# Patient Record
Sex: Female | Born: 1970 | Race: White | Hispanic: No | Marital: Married | Smoking: Current every day smoker
Health system: Southern US, Community
[De-identification: ages and names within clinical notes are randomized; demographics above are authoritative.]

## PROBLEM LIST (undated history)

## (undated) DIAGNOSIS — N979 Female infertility, unspecified: Secondary | ICD-10-CM

## (undated) DIAGNOSIS — O1490 Unspecified pre-eclampsia, unspecified trimester: Secondary | ICD-10-CM

## (undated) DIAGNOSIS — E282 Polycystic ovarian syndrome: Secondary | ICD-10-CM

## (undated) DIAGNOSIS — L409 Psoriasis, unspecified: Secondary | ICD-10-CM

## (undated) HISTORY — DX: Polycystic ovarian syndrome: E28.2

## (undated) HISTORY — DX: Female infertility, unspecified: N97.9

## (undated) HISTORY — PX: KNEE SURGERY: SHX244

## (undated) HISTORY — PX: CHOLECYSTECTOMY: SHX55

## (undated) HISTORY — DX: Psoriasis, unspecified: L40.9

## (undated) HISTORY — DX: Unspecified pre-eclampsia, unspecified trimester: O14.90

---

## 2005-12-16 ENCOUNTER — Emergency Department (HOSPITAL_COMMUNITY): Admission: EM | Admit: 2005-12-16 | Discharge: 2005-12-17 | Payer: Self-pay | Admitting: Emergency Medicine

## 2006-02-13 ENCOUNTER — Ambulatory Visit: Payer: Self-pay | Admitting: Oncology

## 2006-03-12 LAB — CBC & DIFF AND RETIC
BASO%: 0.8 % (ref 0.0–2.0)
EOS%: 2.7 % (ref 0.0–7.0)
HCT: 43.1 % (ref 34.8–46.6)
HGB: 14.9 g/dL (ref 11.6–15.9)
LYMPH%: 32.7 % (ref 14.0–48.0)
MCHC: 34.5 g/dL (ref 32.0–36.0)
MCV: 84.3 fL (ref 81.0–101.0)
NEUT%: 54.1 % (ref 39.6–76.8)
RBC: 5.11 10*6/uL (ref 3.70–5.32)
RDW: 13.4 % (ref 11.3–14.5)
RETIC #: 121.6 10*3/uL — ABNORMAL HIGH (ref 19.7–115.1)
WBC: 7.1 10*3/uL (ref 3.9–10.0)

## 2006-03-12 LAB — MORPHOLOGY: PLT EST: ADEQUATE

## 2006-03-15 LAB — LUPUS ANTICOAGULANT PANEL
DRVVT 1:1 Mix: 41.1 secs (ref 26.75–42.95)
DRVVT: 46.4 secs — ABNORMAL HIGH (ref 26.75–42.95)
PTT Lupus Anticoagulant: 43.7 secs — ABNORMAL HIGH (ref 30.5–43.1)
PTTLA 4:1 Mix: 39.6 secs (ref 30.5–43.1)

## 2006-03-15 LAB — CARDIOLIPIN ANTIBODIES, IGG, IGM, IGA
Anticardiolipin IgA: 11 [APL'U] (ref <13)
Anticardiolipin IgG: <7 [GPL'U] (ref <11)
Anticardiolipin IgM: <7 [MPL'U] (ref <10)

## 2006-03-15 LAB — BETA-2 GLYCOPROTEIN ANTIBODIES: Beta-2-Glycoprotein I IgA: <4 U/mL (ref <10)

## 2006-06-29 ENCOUNTER — Emergency Department (HOSPITAL_COMMUNITY): Admission: EM | Admit: 2006-06-29 | Discharge: 2006-06-29 | Payer: Self-pay | Admitting: Family Medicine

## 2007-02-02 ENCOUNTER — Emergency Department (HOSPITAL_COMMUNITY): Admission: EM | Admit: 2007-02-02 | Discharge: 2007-02-02 | Payer: Self-pay | Admitting: Family Medicine

## 2007-03-18 ENCOUNTER — Ambulatory Visit (HOSPITAL_COMMUNITY): Admission: RE | Admit: 2007-03-18 | Discharge: 2007-03-18 | Payer: Self-pay | Admitting: Obstetrics & Gynecology

## 2007-07-29 ENCOUNTER — Emergency Department (HOSPITAL_COMMUNITY): Admission: EM | Admit: 2007-07-29 | Discharge: 2007-07-29 | Payer: Self-pay | Admitting: Emergency Medicine

## 2007-09-26 HISTORY — PX: ELBOW SURGERY: SHX618

## 2008-05-17 ENCOUNTER — Emergency Department (HOSPITAL_COMMUNITY): Admission: EM | Admit: 2008-05-17 | Discharge: 2008-05-17 | Payer: Self-pay | Admitting: Family Medicine

## 2011-04-07 ENCOUNTER — Emergency Department (HOSPITAL_COMMUNITY)
Admission: EM | Admit: 2011-04-07 | Discharge: 2011-04-08 | Disposition: A | Discharge status: Home / Self Care | Payer: Self-pay | Attending: Emergency Medicine | Admitting: Emergency Medicine

## 2011-04-07 DIAGNOSIS — R0789 Other chest pain: Secondary | ICD-10-CM | POA: Insufficient documentation

## 2011-04-07 DIAGNOSIS — T6391XA Toxic effect of contact with unspecified venomous animal, accidental (unintentional), initial encounter: Secondary | ICD-10-CM | POA: Insufficient documentation

## 2011-04-07 DIAGNOSIS — F17200 Nicotine dependence, unspecified, uncomplicated: Secondary | ICD-10-CM | POA: Insufficient documentation

## 2011-04-07 DIAGNOSIS — R0602 Shortness of breath: Secondary | ICD-10-CM | POA: Insufficient documentation

## 2011-04-07 DIAGNOSIS — T63461A Toxic effect of venom of wasps, accidental (unintentional), initial encounter: Secondary | ICD-10-CM | POA: Insufficient documentation

## 2011-04-07 DIAGNOSIS — M7989 Other specified soft tissue disorders: Secondary | ICD-10-CM | POA: Insufficient documentation

## 2012-07-02 ENCOUNTER — Emergency Department (HOSPITAL_COMMUNITY): Payer: Medicaid Other

## 2012-07-02 ENCOUNTER — Emergency Department (HOSPITAL_COMMUNITY)
Admission: EM | Admit: 2012-07-02 | Discharge: 2012-07-02 | Disposition: A | Discharge status: Home / Self Care | Payer: Medicaid Other | Attending: Emergency Medicine | Admitting: Emergency Medicine

## 2012-07-02 ENCOUNTER — Encounter (HOSPITAL_COMMUNITY): Payer: Self-pay | Admitting: Physical Medicine and Rehabilitation

## 2012-07-02 DIAGNOSIS — S40029A Contusion of unspecified upper arm, initial encounter: Secondary | ICD-10-CM

## 2012-07-02 DIAGNOSIS — M25539 Pain in unspecified wrist: Secondary | ICD-10-CM | POA: Insufficient documentation

## 2012-07-02 MED ORDER — TRAMADOL HCL 50 MG PO TABS: 50.0000 mg | ORAL_TABLET | Freq: Four times a day (QID) | ORAL | Status: DC | PRN

## 2012-07-02 NOTE — ED Provider Notes (Cosign Needed)
History  Scribed for [name], MD, the patient was seen in room TR05C/TR05C. This chart was scribed by Candelaria Stagers. The patient's care started at 11:30 AM   CSN: [id number]  Arrival date & time 07/02/12  4696   First MD Initiated Contact with Patient 07/02/12 1129      Chief Complaint  Patient presents with  . Arm Pain    Patient is a 41 y.o. female presenting with arm pain. The history is provided by the patient. No language interpreter was used.  Arm Pain This is a new problem. The current episode started 12 to 24 hours ago. The problem occurs constantly. The problem has not changed since onset.Pertinent negatives include no chest pain, no abdominal pain and no headaches. Nothing aggravates the symptoms. Nothing relieves the symptoms. She has tried acetaminophen for the symptoms. The treatment provided no relief.   Traci Walker is a 41 y.o. female who presents to the Emergency Department complaining of left arm pain after hitting her arm on a door knob last night.  Pt reports that the pain radiates down to her wrist.  The forearm is swollen.  Pt has taken tylenol with no relief.     No past medical history on file.  No past surgical history on file.  No family history on file.  History  Substance Use Topics  . Smoking status: Never Smoker   . Smokeless tobacco: Not on file  . Alcohol Use: No    OB History    Grav Para Term Preterm Abortions TAB SAB Ect Mult Living                  Review of Systems  Constitutional: Negative for fatigue.  HENT: Negative for congestion, sinus pressure and ear discharge.   Eyes: Negative for discharge.  Respiratory: Negative for cough.   Cardiovascular: Negative for chest pain.  Gastrointestinal: Negative for abdominal pain and diarrhea.  Genitourinary: Negative for frequency and hematuria.  Musculoskeletal: Positive for arthralgias (left forearm pain and swelling). Negative for back pain.  Skin: Negative for rash.    Neurological: Negative for seizures and headaches.  Hematological: Negative.   Psychiatric/Behavioral: Negative for hallucinations.    Allergies  Codeine and Vicodin  Home Medications   Current Outpatient Rx  Name Route Sig Dispense Refill  . IBUPROFEN 200 MG PO TABS Oral Take 200 mg by mouth every 6 (six) hours as needed. For pain    . METFORMIN HCL 500 MG PO TABS Oral Take 500 mg by mouth 2 (two) times daily with a meal.    . THERA M PLUS PO TABS Oral Take 1 tablet by mouth daily.      BP 120/85  Pulse 86  Temp 97.8 F (36.6 C) (Oral)  Resp 20  SpO2 97%  Physical Exam  Constitutional: She is oriented to person, place, and time. She appears well-developed.  HENT:  Head: Normocephalic.  Eyes: Conjunctivae normal are normal.  Neck: No tracheal deviation present.  Cardiovascular:  No murmur heard. Musculoskeletal: Normal range of motion.       Tenderness to proximal left forearm with swelling.  Neurovascularly intact.      Neurological: She is oriented to person, place, and time.  Skin: Skin is warm.  Psychiatric: She has a normal mood and affect.    ED Course  Procedures   DIAGNOSTIC STUDIES: Oxygen Saturation is 97% on room air, normal by my interpretation.    COORDINATION OF CARE:  11:34 Ordered: DG  Forearm Left 12:44 PM Recheck: Discussed imaging results with pt and care for arm injury.   Labs Reviewed - No data to display Dg Forearm Left  07/02/2012  *RADIOLOGY REPORT*  Clinical Data: Trauma.  LEFT FOREARM - 2 VIEW  Comparison: None.  Findings: No fracture or dislocation  IMPRESSION: .  No fracture.   Original Report Authenticated By: Fuller Canada, M.D.      No diagnosis found.    MDM      The chart was scribed for me under my direct supervision.  I personally performed the history, physical, and medical decision making and all procedures in the evaluation of this patient.[name], MD 07/02/12 1246

## 2012-07-02 NOTE — ED Notes (Signed)
Pt presents to department for evaluation of L forearm pain. States she struck arm on doorknob last night. Now states pain and discomfort with movement. 8/10 at the time. Able to wiggle digits and move arm without difficulty. She is alert and oriented x4. No signs of distress noted.

## 2013-04-09 ENCOUNTER — Encounter: Payer: Self-pay | Admitting: Certified Nurse Midwife

## 2013-04-15 ENCOUNTER — Ambulatory Visit: Payer: Self-pay | Admitting: Certified Nurse Midwife

## 2013-05-13 ENCOUNTER — Ambulatory Visit (INDEPENDENT_AMBULATORY_CARE_PROVIDER_SITE_OTHER): Payer: Medicaid Other | Admitting: Certified Nurse Midwife

## 2013-05-13 ENCOUNTER — Encounter: Payer: Self-pay | Admitting: Certified Nurse Midwife

## 2013-05-13 VITALS — BP 102/68 | HR 60 | Resp 16 | Ht 63.75 in | Wt 225.0 lb

## 2013-05-13 DIAGNOSIS — Z0000 Encounter for general adult medical examination without abnormal findings: Secondary | ICD-10-CM

## 2013-05-13 DIAGNOSIS — Z01419 Encounter for gynecological examination (general) (routine) without abnormal findings: Secondary | ICD-10-CM

## 2013-05-13 LAB — POCT URINALYSIS DIPSTICK
Bilirubin, UA: NEGATIVE
Ketones, UA: NEGATIVE
Leukocytes, UA: NEGATIVE
pH, UA: 5

## 2013-05-13 LAB — HEMOGLOBIN, FINGERSTICK: Hemoglobin, fingerstick: 15.1 g/dL (ref 12.0–16.0)

## 2013-05-13 NOTE — Patient Instructions (Addendum)

## 2013-05-13 NOTE — Progress Notes (Signed)
42 y.o. G73P1001 Married Caucasian Fe here for annual exam. Periods normal except one light period. Contraception none desired, due to history of infertility. Has started new exercise program. Busy with caring spouse with work related back injured. Currently not working. No health issues, today.  Sees PCP prn and had labs done all normal. Has been treating ingrown hair  With epsom salt soaks, opened and drained now healing. Denies fever or chills.  Has been "sweating a lot with exercise.  No other health issues today.  Patient's last menstrual period was 04/27/2013.          Sexually active: yes  The current method of family planning is none.    Exercising: yes  karate, kickboxing & krav maga Smoker:  yes  Health Maintenance: Pap: 04-10-12 neg HPV HR neg MMG:  none Colonoscopy:  none BMD:   none TDaP:  2006 Labs: Poct urine-neg, Hgb-15.1 Self breast exam: done occ   reports that she has been smoking.  She does not have any smokeless tobacco history on file. She reports that she does not drink alcohol or use illicit drugs.  Past Medical History  Diagnosis Date  . Toxemia in pregnancy   . PCOS (polycystic ovarian syndrome)   . Infertility, female     clomid use  . Psoriasis     Past Surgical History  Procedure Laterality Date  . Cholecystectomy    . Cesarean section    . Elbow surgery  2009    right elbow, release of nerve    Current Outpatient Prescriptions  Medication Sig Dispense Refill  . ibuprofen (ADVIL,MOTRIN) 200 MG tablet Take 200 mg by mouth every 6 (six) hours as needed. For pain       No current facility-administered medications for this visit.    Family History  Problem Relation Age of Onset  . Factor VIII deficiency Father   . Factor VIII deficiency Son     ROS:  Pertinent items are noted in HPI.  Otherwise, a comprehensive ROS was negative.  Exam:   BP 102/68  Pulse 60  Resp 16  Ht 5' 3.75" (1.619 m)  Wt 225 lb (102.059 kg)  BMI 38.94 kg/m2  LMP  04/27/2013 Height: 5' 3.75" (161.9 cm)  Ht Readings from Last 3 Encounters:  05/13/13 5' 3.75" (1.619 m)    General appearance: alert, cooperative and appears stated age Head: Normocephalic, without obvious abnormality, atraumatic Neck: no adenopathy, supple, symmetrical, trachea midline and thyroid normal to inspection and palpation Lungs: clear to auscultation bilaterally Breasts: normal appearance, no masses or tenderness, No nipple retraction or dimpling, No nipple discharge or bleeding, No axillary or supraclavicular adenopathy Heart: regular rate and rhythm Abdomen: soft, non-tender; no masses,  no organomegaly Extremities: extremities normal, atraumatic, no cyanosis or edema Skin: Skin color, texture, turgor normal. No rashes or lesions Lymph nodes: Cervical, supraclavicular, and axillary nodes normal. No abnormal inguinal nodes palpated Neurologic: Grossly normal   Pelvic: External genitalia:  no lesions              Urethra:  normal appearing urethra with no masses, tenderness or lesions              Bartholin's and Skene's: normal                 Vagina: normal appearing vagina with normal color and discharge, no lesions              Cervix: normal, non tender  Pap taken: no Bimanual Exam:  Uterus:  normal size, contour, position, consistency, mobility, non-tender and mid position              Adnexa: normal adnexa and no mass, fullness, tenderness               Rectovaginal: Confirms               Anus:  normal sphincter tone, no lesions  A:  Well Woman with normal exam  Contraception none desired infertility history  Healing in grown hair cyst  Social stress with spouse  P:   Reviewed health and wellness pertinent to exam  Continue epsom salt soaks until resolves, come in if not resolving or other areas occur  Seek family support and friends support and time for self  Pap smear as per guidelines   Mammogram yearly, given information to schedule pap  smear not taken today  counseled on breast self exam, mammography screening, adequate intake of calcium and vitamin D, diet and exercise, Kegel's exercises  return annually or prn  An After Visit Summary was printed and given to the patient.

## 2013-05-15 ENCOUNTER — Other Ambulatory Visit: Payer: Self-pay

## 2013-05-15 DIAGNOSIS — Z1231 Encounter for screening mammogram for malignant neoplasm of breast: Secondary | ICD-10-CM

## 2013-05-16 NOTE — Progress Notes (Signed)
Note reviewed, agree with plan.  Aila Terra, MD  

## 2013-06-04 ENCOUNTER — Ambulatory Visit
Admission: RE | Admit: 2013-06-04 | Discharge: 2013-06-04 | Disposition: A | Discharge status: Home / Self Care | Payer: Medicaid Other | Source: Ambulatory Visit

## 2013-06-04 DIAGNOSIS — Z1231 Encounter for screening mammogram for malignant neoplasm of breast: Secondary | ICD-10-CM

## 2013-06-06 ENCOUNTER — Other Ambulatory Visit: Payer: Self-pay | Admitting: Certified Nurse Midwife

## 2013-06-06 DIAGNOSIS — R928 Other abnormal and inconclusive findings on diagnostic imaging of breast: Secondary | ICD-10-CM

## 2013-06-13 ENCOUNTER — Ambulatory Visit
Admission: RE | Admit: 2013-06-13 | Discharge: 2013-06-13 | Disposition: A | Discharge status: Home / Self Care | Payer: Medicaid Other | Source: Ambulatory Visit | Attending: Certified Nurse Midwife | Admitting: Certified Nurse Midwife

## 2013-06-13 DIAGNOSIS — R928 Other abnormal and inconclusive findings on diagnostic imaging of breast: Secondary | ICD-10-CM

## 2013-06-16 NOTE — Progress Notes (Signed)
Placed on Mammo Hold 06/06/13 cm

## 2013-06-19 ENCOUNTER — Telehealth: Payer: Self-pay | Admitting: Emergency Medicine

## 2013-06-19 NOTE — Telephone Encounter (Signed)
Message copied by Joeseph Amor on Thu Jun 19, 2013  2:16 PM ------      Message from: Verner Chol      Created: Wed Jun 18, 2013  8:13 AM       Notify patient reviewed mammogram and [place] agree with findings. Keep aex for breast exam, mammogram as designated and do SBE, if change needs OV      Can take out MM hold ------

## 2013-06-19 NOTE — Progress Notes (Signed)
Spoke with patient, result given. Will follow up prn.

## 2013-06-19 NOTE — Telephone Encounter (Signed)
Fowarding to Dr. Hyacinth Meeker for MD check, to review to remove from [facility] hold.

## 2013-06-19 NOTE — Telephone Encounter (Signed)
Okay per Dr. Hyacinth Meeker to remove.

## 2013-07-31 ENCOUNTER — Other Ambulatory Visit: Payer: Self-pay

## 2013-09-04 ENCOUNTER — Telehealth: Payer: Self-pay | Admitting: Certified Nurse Midwife

## 2013-09-04 NOTE — Telephone Encounter (Signed)
Pt thinks she may be pregnant she would like to talk with the nurse.

## 2013-09-04 NOTE — Telephone Encounter (Signed)
Spoke with pt who desires pregnancy. LMP 08-02-13. Pt recently lost 25 lbs and typically has menses every 32 days. Pt has talked to DL about progesterone shots at earlier visit. Pt reports she is about 2 days late at this point and has had a little nausea with some cramping and spotting last week. Has not taken UPT. How would you like pt to proceed? OV?

## 2013-09-04 NOTE — Telephone Encounter (Signed)
LMTCB  aa 

## 2013-09-04 NOTE — Telephone Encounter (Signed)
She will need OV

## 2013-09-05 NOTE — Telephone Encounter (Signed)
Patient states she has started spotting last night and has increased bleeding. Feels she has started menses. Advised of message from Verner Chol CNM that needs office visit to discuss bleeding. Patient is agreeable. Discussed OV for 12/17 at 10:00. Patient is medicaid, needs referral for OV.

## 2013-09-05 NOTE — Telephone Encounter (Signed)
Patient was returning call to amy.

## 2013-09-08 NOTE — Telephone Encounter (Signed)
Appointment scheduled. Patient aware.   Routing to provider for final review. Patient agreeable to disposition. Will close encounter

## 2013-09-10 ENCOUNTER — Ambulatory Visit: Payer: Medicaid Other | Admitting: Certified Nurse Midwife

## 2013-10-02 ENCOUNTER — Ambulatory Visit (INDEPENDENT_AMBULATORY_CARE_PROVIDER_SITE_OTHER): Payer: Medicaid Other | Admitting: Certified Nurse Midwife

## 2013-10-02 ENCOUNTER — Encounter: Payer: Self-pay | Admitting: Certified Nurse Midwife

## 2013-10-02 VITALS — BP 104/64 | HR 68 | Resp 16 | Ht 63.75 in | Wt 218.0 lb

## 2013-10-02 DIAGNOSIS — Z3009 Encounter for other general counseling and advice on contraception: Secondary | ICD-10-CM

## 2013-10-02 NOTE — Progress Notes (Signed)
43 y.o. Married Caucasian female G1P1001 here for pre conceptual consult.Patient has been having regular periods for the past 6 months now. She has been working on weight loss,(down 32 pounds) exercise and decrease smoking. Smoking now less than half a pack now. Periods are exactly 34 days and LMP 09/01/13,  was again 34 days Patient has noted ovulatory mucous each time around 17-18 day of cycle. She and spouse have been trying for pregnancy the past 2 months. She would really like another baby!. Spouse supportive. Here today to validate her cycles and discuss concerns.  Not currently on prenatal vitamins, but plans to start today. Patient had fertility assistance years ago (IUI) x1 and it did not work. Spouse has reservations about seeing infertility again, but will if patient desires.  O: Healthy WD,WN female Affect: Normal, orientation x 3 Aex 05/13/13 normal, pap smear normal with negative HPVHR in 2013   A:History of PCOS with irregular cycles and infertility,cycles now very regular, with health and weight change Clomid use with first pregnancy 2004 Trial of Clomid here in 2013.   P: Discussed history of fertility/irregular cycles and clomid use. Discussed current age with another pregnancy. Patient feels she is in the best health ever. Discussed with this being the case,utilizing the best option for the best chance of pregnancy would be encouraged. Recommended consult visit with [facility]. Patient would prefer this group. Patient questions addressed and agrees with this and feels spouse will too. Patient to await to see if she has next period and then will advise regarding referral.. Patient plans ovulation kit and temperature checks   RV prn   35 minutes spent with patient with >50% of time spent in face to face counseling.

## 2013-10-03 NOTE — Progress Notes (Signed)
Reviewed personally.  M. Suzanne Breeonna Mone, MD.  

## 2014-01-08 ENCOUNTER — Telehealth: Payer: Self-pay | Admitting: Certified Nurse Midwife

## 2014-01-08 NOTE — Telephone Encounter (Signed)
Spoke with pt who has been trying to conceive. Pt was about 2-3 days late with her cycle on Monday when she noticed bleeding and cramps in her lower back. Today she passed a big clot about an inch and a half long with what looked like a grain of rice in it or "something the size of a pomegranate seed." Today pt went through a super tampon in an hour while she was out shopping. Pt is having cramps as well. Pt has not taken a UPT and did not know for sure if she was pregnant. Please advise. OV today or tomorrow?

## 2014-01-08 NOTE — Telephone Encounter (Signed)
Spoke with pt to advise per DL that she could take a pregnancy test and monitor bleeding over the next hour or 2. Appt scheduled for tomorrow am at 10 with DL. Pt reports this is Day 4 of her bleeding and it is a little heavier than usual cycle. Pt will call back as needed for heavier bleeding or further clots. Pt will take UPT as well.

## 2014-01-08 NOTE — Telephone Encounter (Signed)
Patient calling to report, " I have been bleeding heavily for the past three days and passed a big clot with a white, grain of rice-like substance in it." Patient reports she has been trying to get pregnant and want to speak with nurse.

## 2014-01-09 ENCOUNTER — Ambulatory Visit (INDEPENDENT_AMBULATORY_CARE_PROVIDER_SITE_OTHER): Payer: Medicaid Other | Admitting: Certified Nurse Midwife

## 2014-01-09 ENCOUNTER — Encounter: Payer: Self-pay | Admitting: Certified Nurse Midwife

## 2014-01-09 VITALS — BP 96/60 | HR 68 | Temp 98.0°F | Resp 16 | Ht 63.75 in | Wt 221.0 lb

## 2014-01-09 DIAGNOSIS — Z0189 Encounter for other specified special examinations: Secondary | ICD-10-CM

## 2014-01-09 DIAGNOSIS — N912 Amenorrhea, unspecified: Secondary | ICD-10-CM

## 2014-01-09 LAB — POCT URINE PREGNANCY: Preg Test, Ur: NEGATIVE

## 2014-01-09 LAB — HCG, QUANTITATIVE, PREGNANCY: hCG, Beta Chain, Quant, S: <2 m[IU]/mL

## 2014-01-09 LAB — HEMOGLOBIN, FINGERSTICK: Hemoglobin, fingerstick: 15.2 g/dL (ref 12.0–16.0)

## 2014-01-09 NOTE — Telephone Encounter (Signed)
Agree Closed encounter

## 2014-01-09 NOTE — Progress Notes (Signed)
  43 y.o.Married Caucasian female presents with this period being 3 days late with onset of heavy bleeding with passage of small clot with ? White grain like substance with cramping.( patient brought with her). Patient has been not using contraception, hoping for pregnancy. Patient reports bleeding onset 01/05/14 and is now moderate to small. Previous period was 12/01/12 with 5 day duration. Denies any pregnancy symptoms or home UPT use. Patient felt like the onset of this period was different. UPT here negative. Patient denies any other symptoms. Has used only 2 pads this am.   General appearance: alert, cooperative, no distress and mildly obese Abdomen: non tender, soft Pelvic: cervix normal in appearance, external genitalia normal, no adnexal masses or tenderness, no bladder tenderness, no cervical motion tenderness, uterus normal size, shape, and consistency and small amount of blood in vagina and from cervix noted Skin: warm and dry  Assessment:  Normal pelvic exam ?SAB Period late by 3 days with change in onset,duration same, bleeding small on exam Negative UPT  Plan: Discussed findings of normal exam and normal bleeding profile by description. Discussed cycle changes with age and weight changes also.  Discussed negative UPT, but will draw HCG quantative and advise patient of results. Will manage per results. Lab HCG Quant., POCT Hgb. Specimen to pathology patient brought in. Warning signs of heavy bleeding given. Questions addressed  Rv prn.

## 2014-01-13 LAB — IPS OTHER TISSUE BIOPSY

## 2014-01-16 NOTE — Progress Notes (Signed)
Reviewed personally.  M. Suzanne Kieren Adkison, MD.  

## 2014-01-23 ENCOUNTER — Telehealth: Payer: Self-pay | Admitting: *Deleted

## 2014-01-23 NOTE — Telephone Encounter (Signed)
Follow up call to patient. Notified of path result as directed by Debbi. She states bleeding resolved about 4 days after OV.  Denies problems with bleeding now.  Instructed to call back if reoccurs.  Routing to provider for final review. Patient agreeable to disposition. Will close encounter

## 2014-01-23 NOTE — Telephone Encounter (Signed)
Message copied by Alisa GraffYEAKLEY, Fadil Macmaster on Fri Jan 23, 2014  1:53 PM ------      Message from: Verner CholLEONARD, DEBORAH S      Created: Tue Jan 13, 2014 10:58 AM       Notify patient that tissue sent to pathology did not contain any products of conception      It contained just benign tissue from endometrium(lining of uterus)      Bleeding status now? ------

## 2014-07-10 ENCOUNTER — Other Ambulatory Visit: Payer: Self-pay

## 2014-07-27 ENCOUNTER — Encounter: Payer: Self-pay | Admitting: Certified Nurse Midwife

## 2014-09-16 ENCOUNTER — Encounter: Payer: Self-pay | Admitting: Physical Therapy

## 2014-09-16 ENCOUNTER — Ambulatory Visit: Payer: Medicaid Other | Attending: Orthopedic Surgery | Admitting: Physical Therapy

## 2014-09-16 DIAGNOSIS — M25562 Pain in left knee: Secondary | ICD-10-CM | POA: Diagnosis present

## 2014-09-16 DIAGNOSIS — G8929 Other chronic pain: Secondary | ICD-10-CM

## 2014-09-16 NOTE — Therapy (Signed)
[facility] [address] [place], [place], [address] Phone: [phone]   Fax:  [phone]  Physical Therapy Evaluation and DISCHARGE Patient Details  Name: Traci Walker MRN: [id number] Date of Birth: 11/19/1970  Encounter Date: 09/16/2014      PT End of Session - 09/16/14 1225    Visit Number 1   Number of Visits 1   PT Start Time 1105   PT Stop Time 1145   PT Time Calculation (min) 40 min   Activity Tolerance Patient tolerated treatment well      Past Medical History  Diagnosis Date  . Toxemia in pregnancy   . PCOS (polycystic ovarian syndrome)   . Infertility, female     clomid use  . Psoriasis     Past Surgical History  Procedure Laterality Date  . Cholecystectomy    . Cesarean section    . Elbow surgery  2009    right elbow, release of nerve    There were no vitals taken for this visit.  Visit Diagnosis:  Knee pain, chronic, left - Plan: PT plan of care cert/re-cert      Subjective Assessment - 09/16/14 1115    Symptoms Pain, popping and stiffness in Lt. knee.     Limitations Standing;Walking;House hold activities;Other (comment)  prolonged bending, stairs, running   How long can you sit comfortably? 15-20 min   How long can you stand comfortably? 15-20 min   How long can you walk comfortably? 15-20 min    Diagnostic tests Neg MRI, pos chondromalacia patella   Patient Stated Goals return to activity [employer])   Currently in Pain? Yes   Pain Score 1   4/10 with actvitiy   Pain Location Knee   Pain Orientation Left;Medial;Anterior;Lateral   Pain Descriptors / Indicators Nagging;Shooting  annoying   Pain Type Chronic pain   Pain Onset More than a month ago   Pain Frequency Intermittent   Aggravating Factors  walking, standing    Pain Relieving Factors Aleve, prop   Effect of Pain on Daily Activities can't be as active as i want to be   Multiple Pain Sites No          [facility] -  09/16/14 1125    Assessment   Medical Diagnosis --  L. knee pain   Onset Date 03/17/14   Next MD Visit Oct 20, 2014   Prior Therapy no   Precautions   Required Braces or Orthoses --  not required but wears L. knee brace when out   Balance Screen   Has the patient fallen in the past 6 months No   Has the patient had a decrease in activity level because of a fear of falling?  No   Is the patient reluctant to leave their home because of a fear of falling?  No   Sensation   Light Touch Appears Intact   Coordination   Gross Motor Movements are Fluid and Coordinated Yes   Fine Motor Movements are Fluid and Coordinated Not tested   Posture/Postural Control   Posture Comments WNL   AROM   Right Knee Extension --  0   Right Knee Flexion 138   Left Knee Extension 0   Left Knee Flexion 132   Strength   Right Hip Flexion 5/5   Right Hip ABduction 5/5   Left Hip Flexion 5/5   Left Hip ABduction 5/5   Right Knee Flexion 5/5   Right Knee Extension  5/5   Left Knee Flexion --  4+/5   Left Knee Extension --  4+/5                  [facility] Adult PT Treatment/Exercise - 09/16/14 1125    Knee/Hip Exercises: Stretches   Active Hamstring Stretch 2 reps;30 seconds   ITB Stretch 2 reps;30 seconds   Knee/Hip Exercises: Standing   Wall Squat 1 set;5 reps   Wall Squat Limitations --  modify for pain   Knee/Hip Exercises: Supine   Straight Leg Raise with External Rotation Left;1 set;10 reps                PT Education - 09/16/14 1140    Education provided Yes   Education Details PT/HEP/Group exercise/[facility]   Person(s) Educated Patient   Methods Explanation;Demonstration;Handout   Comprehension Verbalized understanding;Returned demonstration                    Plan - 09/16/14 1230    Clinical Impression Statement Patient does not have coverage for PT with [facility].  She was given multiple options today for guided exercise, including an upgraded HEP for VMO  and stretching. She may choose to attend our group exercise group for continued care.    Pt will benefit from skilled therapeutic intervention in order to improve on the following deficits Decreased range of motion;Impaired flexibility;Pain;Decreased strength;Decreased mobility   Rehab Potential Excellent   PT Frequency --  eval only    PT Next Visit Plan NA   PT Home Exercise Plan VMO and ITB stretch, hamstrings   Consulted and Agree with Plan of Care Patient         Problem List There are no active problems to display for this patient.   Zlaty Alexa 09/16/2014, 12:38 PM  [facility] [address] [place], [place], [address] Phone: [phone]   Fax:  [phone]  Karie MainlandJennifer Aniel Hubble, PT 09/16/2014 12:39 PM Phone: [phone] Fax: [phone]

## 2014-09-16 NOTE — Patient Instructions (Addendum)
Abduction: Clam (Eccentric) - Side-Lying   Lie on side with knees bent. Lift top knee, keeping feet together. Keep trunk steady. Slowly lower for 3-5 seconds. __20_ reps per set, __2_ sets per day, ___5-7 days per week.   Copyright  VHI. All rights reserved.  Hip Flexion / Knee Extension: Straight-Leg Raise (Eccentric)   Lie on back. Lift leg with knee straight. Slowly lower leg for 3-5 seconds. __10-20_ reps per set, _1-2__ sets per day, __5-7_ days per week. Lower like elevator, stopping at each floor.  Rest on elbows. Rest on straight arms.  Copyright  VHI. All rights reserved.  Leg Extension (Hamstring)   Sit toward front edge of chair, with leg out straight, heel on floor, toes pointing toward body. Keeping back straight, bend forward at hip, breathing out through pursed lips. Return, breathing in. Repeat ___ times. Repeat with other leg. Do ___ sessions per day. Variation: Perform from standing position, with support.  Hamstring Step 1   Straighten left knee. Keep knee level with other knee or on bolster. Hold ___ seconds. Relax knee by returning foot to start. Repeat ___ times.      Hamstring Step 2   Left foot relaxed, knee straight, other leg bent, foot flat. Raise straight leg further upward to maximal range. Hold _30__ seconds. Relax leg completely down. Repeat _3__ times. Lower leg slightly  THEN Cross LT. Leg over midline to stretch ITB (Iliotibial band)

## 2014-09-22 ENCOUNTER — Ambulatory Visit: Payer: Medicaid Other | Admitting: Physical Therapy

## 2015-02-23 ENCOUNTER — Emergency Department (HOSPITAL_COMMUNITY): Payer: Medicaid Other

## 2015-02-23 ENCOUNTER — Encounter (HOSPITAL_COMMUNITY): Payer: Self-pay | Admitting: *Deleted

## 2015-02-23 ENCOUNTER — Emergency Department (HOSPITAL_COMMUNITY)
Admission: EM | Admit: 2015-02-23 | Discharge: 2015-02-23 | Disposition: A | Discharge status: Home / Self Care | Payer: Medicaid Other | Attending: Emergency Medicine | Admitting: Emergency Medicine

## 2015-02-23 DIAGNOSIS — Z79899 Other long term (current) drug therapy: Secondary | ICD-10-CM | POA: Diagnosis not present

## 2015-02-23 DIAGNOSIS — Z872 Personal history of diseases of the skin and subcutaneous tissue: Secondary | ICD-10-CM | POA: Insufficient documentation

## 2015-02-23 DIAGNOSIS — J189 Pneumonia, unspecified organism: Secondary | ICD-10-CM

## 2015-02-23 DIAGNOSIS — Z8639 Personal history of other endocrine, nutritional and metabolic disease: Secondary | ICD-10-CM | POA: Insufficient documentation

## 2015-02-23 DIAGNOSIS — Z720 Tobacco use: Secondary | ICD-10-CM | POA: Insufficient documentation

## 2015-02-23 DIAGNOSIS — J159 Unspecified bacterial pneumonia: Secondary | ICD-10-CM | POA: Diagnosis not present

## 2015-02-23 DIAGNOSIS — Z8742 Personal history of other diseases of the female genital tract: Secondary | ICD-10-CM | POA: Insufficient documentation

## 2015-02-23 DIAGNOSIS — Z792 Long term (current) use of antibiotics: Secondary | ICD-10-CM | POA: Insufficient documentation

## 2015-02-23 DIAGNOSIS — R091 Pleurisy: Secondary | ICD-10-CM | POA: Diagnosis present

## 2015-02-23 LAB — BASIC METABOLIC PANEL
ANION GAP: 9 (ref 5–15)
BUN: 16 mg/dL (ref 6–20)
CALCIUM: 8.8 mg/dL — AB (ref 8.9–10.3)
CO2: 22 mmol/L (ref 22–32)
Chloride: 108 mmol/L (ref 101–111)
Creatinine, Ser: 0.96 mg/dL (ref 0.44–1.00)
GFR calc Af Amer: >60 mL/min (ref >60)
GFR calc non Af Amer: >60 mL/min (ref >60)
Glucose, Bld: 125 mg/dL — ABNORMAL HIGH (ref 65–99)
POTASSIUM: 3.8 mmol/L (ref 3.5–5.1)
SODIUM: 139 mmol/L (ref 135–145)

## 2015-02-23 LAB — CBC
HCT: 41.1 % (ref 36.0–46.0)
Hemoglobin: 13.6 g/dL (ref 12.0–15.0)
MCH: 28.8 pg (ref 26.0–34.0)
MCHC: 33.1 g/dL (ref 30.0–36.0)
MCV: 87.1 fL (ref 78.0–100.0)
Platelets: 258 10*3/uL (ref 150–400)
RBC: 4.72 MIL/uL (ref 3.87–5.11)
RDW: 13.3 % (ref 11.5–15.5)
WBC: 14.2 10*3/uL — ABNORMAL HIGH (ref 4.0–10.5)

## 2015-02-23 LAB — D-DIMER, QUANTITATIVE: D-Dimer, Quant: <0.27 ug/mL-FEU (ref 0.00–0.48)

## 2015-02-23 LAB — I-STAT TROPONIN, ED: TROPONIN I, POC: 0.01 ng/mL (ref 0.00–0.08)

## 2015-02-23 MED ORDER — GI COCKTAIL ~~LOC~~
30.0000 mL | Freq: Once | ORAL | Status: AC
  Administered 2015-02-23: 30 mL via ORAL
  Filled 2015-02-23: qty 30

## 2015-02-23 MED ORDER — AEROCHAMBER PLUS FLO-VU LARGE MISC
1.0000 | Freq: Once | Status: AC
  Administered 2015-02-23: 1
  Filled 2015-02-23: qty 1

## 2015-02-23 MED ORDER — LEVOFLOXACIN 750 MG PO TABS
750.0000 mg | ORAL_TABLET | Freq: Once | ORAL | Status: AC
  Administered 2015-02-23: 750 mg via ORAL
  Filled 2015-02-23: qty 1

## 2015-02-23 MED ORDER — ALBUTEROL SULFATE HFA 108 (90 BASE) MCG/ACT IN AERS
2.0000 | INHALATION_SPRAY | Freq: Once | RESPIRATORY_TRACT | Status: AC
  Administered 2015-02-23: 2 via RESPIRATORY_TRACT
  Filled 2015-02-23: qty 6.7

## 2015-02-23 MED ORDER — PANTOPRAZOLE SODIUM 40 MG IV SOLR
40.0000 mg | INTRAVENOUS | Status: AC
  Administered 2015-02-23: 40 mg via INTRAVENOUS
  Filled 2015-02-23: qty 40

## 2015-02-23 MED ORDER — PANTOPRAZOLE SODIUM 20 MG PO TBEC: 20.0000 mg | DELAYED_RELEASE_TABLET | Freq: Every day | ORAL | Status: DC

## 2015-02-23 MED ORDER — BENZONATATE 100 MG PO CAPS: 100.0000 mg | ORAL_CAPSULE | Freq: Three times a day (TID) | ORAL | Status: DC

## 2015-02-23 MED ORDER — LEVOFLOXACIN 250 MG PO TABS: 750.0000 mg | ORAL_TABLET | Freq: Every day | ORAL | Status: DC

## 2015-02-23 NOTE — ED Notes (Signed)
Pt ambulating independently w/ steady gait on d/c in no acute distress, A&Ox4. D/c instructions reviewed w/ pt and family - pt and family deny any further questions or concerns at present. Rx given x3  

## 2015-02-23 NOTE — ED Notes (Signed)
Bed: WA09 Expected date:  Expected time:  Means of arrival:  Comments: Chest wall pain

## 2015-02-23 NOTE — Discharge Instructions (Signed)

## 2015-02-23 NOTE — ED Provider Notes (Signed)
CSN: [id number]     Arrival date & time 02/23/15  0002 History   First MD Initiated Contact with Patient 02/23/15 0109     Chief Complaint  Patient presents with  . Pleurisy    (Consider location/radiation/quality/duration/timing/severity/associated sxs/prior Treatment) HPI Comments: Patient is a 44 year old female with a history of PCOS and psoriasis who presents to the emergency department for further evaluation of chest pain. Patient states that chest pain has been intermittent and began yesterday. Symptoms worsened this evening. She describes the pain as a sharp and burning pain. Pain initially began yesterday after eating. She felt the discomfort travel to her throat which prompted onset of coughing. Patient has had a dry cough intermittently since this time. Coughing persisted throughout the night; little relief from Mucinex. She awoke today with nasal congestion. Patient reports pain radiating from her epigastric region around her R upper abdomen to her back. Pain is worse with deep breathing. Son sick with strep 2 weeks ago; no other sick contacts. No fever, abdominal pain, N/V/D, or syncope. No leg swelling, hormone replacement use, or recent travel. Patient did have arthroscopic knee surgery within the last month. She did not stay in the hospital overnight following this procedure. No history of DVT/PE.  The history is provided by the patient. No language interpreter was used.    Past Medical History  Diagnosis Date  . Toxemia in pregnancy   . PCOS (polycystic ovarian syndrome)   . Infertility, female     clomid use  . Psoriasis    Past Surgical History  Procedure Laterality Date  . Cholecystectomy    . Cesarean section    . Elbow surgery  2009    right elbow, release of nerve   Family History  Problem Relation Age of Onset  . Factor VIII deficiency Father   . Factor VIII deficiency Son    History  Substance Use Topics  . Smoking status: Current Every Day Smoker -- 0.50  packs/day    Types: Cigarettes  . Smokeless tobacco: Not on file  . Alcohol Use: Yes     Comment: occasionally   OB History    Gravida Para Term Preterm AB TAB SAB Ectopic Multiple Living   1 1 1       1       Review of Systems  Constitutional: Negative for fever.  HENT: Positive for congestion. Negative for trouble swallowing.   Respiratory: Positive for cough. Negative for shortness of breath.   Cardiovascular: Positive for chest pain.  Gastrointestinal: Negative for vomiting and diarrhea.  Neurological: Negative for syncope.  All other systems reviewed and are negative.   Allergies  Codeine and Vicodin  Home Medications   Prior to Admission medications   Medication Sig Start Date End Date Taking? Authorizing Provider  doxycycline (VIBRA-TABS) 100 MG tablet Take 100 mg by mouth 2 (two) times daily. 01/21/15  Yes Historical Provider, MD  ibuprofen (ADVIL,MOTRIN) 200 MG tablet Take 200 mg by mouth every 6 (six) hours as needed (for menstrual pain.). For pain   Yes Historical Provider, MD  ketorolac (TORADOL) 10 MG tablet Take 10 mg by mouth 3 (three) times daily. 02/11/15  Yes Historical Provider, MD  ondansetron (ZOFRAN) 4 MG tablet Take 4 mg by mouth every 6 (six) hours as needed. 02/11/15  Yes Historical Provider, MD  oxyCODONE-acetaminophen (PERCOCET/ROXICET) 5-325 MG per tablet Take 1-2 tablets by mouth every 6 (six) hours as needed. 02/11/15  Yes Historical Provider, MD  Phenyleph-Doxylamine-DM-APAP 5-6.25-10-325 MG/15ML LIQD  Take 30 mLs by mouth once.   Yes Historical Provider, MD  Phenylephrine-APAP-Guaifenesin 10-650-400 MG/20ML LIQD Take 20 mLs by mouth every 6 (six) hours as needed (for cold symptoms).   Yes Historical Provider, MD  benzonatate (TESSALON) 100 MG capsule Take 1 capsule (100 mg total) by mouth every 8 (eight) hours. 02/23/15   Antony Madura, PA-C  levofloxacin (LEVAQUIN) 250 MG tablet Take 3 tablets (750 mg total) by mouth daily. Your next dose is due on the  morning of 02/24/15 02/23/15   Antony Madura, PA-C  pantoprazole (PROTONIX) 20 MG tablet Take 1 tablet (20 mg total) by mouth daily. 02/23/15   Antony Madura, PA-C   BP 120/74 mmHg  Pulse 87  Temp(Src) 98.3 F (36.8 C) (Oral)  Resp 21  SpO2 97%  LMP 02/07/2015 (Exact Date)   Physical Exam  Constitutional: She is oriented to person, place, and time. She appears well-developed and well-nourished. No distress.  Nontoxic/nonseptic appearing  HENT:  Head: Normocephalic and atraumatic.  Mucosal edema in bilateral nares appreciated. Patient tolerating secretions without difficulty.  Eyes: Conjunctivae and EOM are normal. No scleral icterus.  Neck: Normal range of motion.  No nuchal rigidity or meningismus  Cardiovascular: Normal rate, regular rhythm and intact distal pulses.   Pulmonary/Chest: Effort normal and breath sounds normal. No respiratory distress. She has no wheezes. She has no rales.  Respirations even and unlabored. No wheezes or rales appreciated. No tachypnea or dyspnea. No accessory muscle use.  Abdominal: Soft. She exhibits no distension. There is no tenderness. There is no rebound and no guarding.  Soft, nontender abdomen. No masses or peritoneal signs.  Musculoskeletal: Normal range of motion.  Neurological: She is alert and oriented to person, place, and time. She exhibits normal muscle tone. Coordination normal.  Skin: Skin is warm and dry. No rash noted. She is not diaphoretic. No erythema. No pallor.  Psychiatric: She has a normal mood and affect. Her behavior is normal.  Nursing note and vitals reviewed.   ED Course  Procedures (including critical care time) Labs Review Labs Reviewed  CBC - Abnormal; Notable for the following:    WBC 14.2 (*)    All other components within normal limits  BASIC METABOLIC PANEL - Abnormal; Notable for the following:    Glucose, Bld 125 (*)    Calcium 8.8 (*)    All other components within normal limits  D-DIMER, QUANTITATIVE (NOT AT  [facility])  Rosezena Sensor, ED    Imaging Review Dg Chest 2 View  02/23/2015   CLINICAL DATA:  Acute onset of upper chest pain.  Initial encounter.  EXAM: CHEST  2 VIEW  COMPARISON:  None.  FINDINGS: The lungs are well-aerated and clear. There is no evidence of focal opacification, pleural effusion or pneumothorax.  The heart is normal in size; the mediastinal contour is within normal limits. No acute osseous abnormalities are seen.  IMPRESSION: No acute cardiopulmonary process seen.   Electronically Signed   By: Roanna Raider M.D.   On: 02/23/2015 01:45     EKG Interpretation   Date/Time:  Tuesday Feb 23 2015 00:15:51 EDT Ventricular Rate:  83 PR Interval:  185 QRS Duration: 85 QT Interval:  362 QTC Calculation: 425 R Axis:   48 Text Interpretation:  Sinus rhythm Low voltage, precordial leads Confirmed  by HARRISON  MD, FORREST ([id number]) on 02/23/2015 12:19:20 AM      MDM   Final diagnoses:  CAP (community acquired pneumonia)    44 year old female presents  to the emergency department for further evaluation of right sided pleuritic chest pain. Symptoms associated with cough and nasal congestion with postnasal drip. Patient denies sick contacts. She is nontoxic and nonseptic appearing. Patient's cardiac workup today is reassuring. Doubt pulmonary embolism and d-dimer today is also negative. She is low risk for DVT/PE. Concern for early pneumonia given the leukocytosis of 14.2 with pleuritic pain on the right. No evidence of focal consolidation on Xray, but may be too early to evaluate for this on radiograph. Some components of patient's symptoms also seem consistent with esophageal reflux or gastritis, though right-sided nature of patient's pain is atypical for this. She has a hx of cholecystectomy 11 years ago.  Plan to cover patient for early pneumonia today with Levaquin. Patient given albuterol inhaler and tessalon for cough. Will also start the patient on Protonix to cover for  reflux/gastritis. Patient advised to follow-up with her primary care provider for further evaluation of symptoms. Return precautions discussed and provided. Patient agreeable to plan with no unaddressed concerns. Patient discharged in good condition.   Filed Vitals:   02/23/15 0003 02/23/15 0010 02/23/15 0230 02/23/15 0259  BP:  133/84 117/76 120/74  Pulse:  91 89 87  Temp:  98.3 F (36.8 C)    TempSrc:  Oral    Resp:  SpO2: 97% 96% 100% 97%     Antony Madura, PA-C 02/23/15 0543  Purvis Sheffield, MD 02/23/15 1726

## 2015-02-23 NOTE — ED Notes (Signed)
Pt admits to cough that began yesterday, progressively worse - pt admits not to right rib pain that radiates to her back. Pt also c/o HA as well.

## 2015-02-24 ENCOUNTER — Emergency Department (HOSPITAL_COMMUNITY): Payer: Medicaid Other

## 2015-02-24 ENCOUNTER — Encounter (HOSPITAL_COMMUNITY): Payer: Self-pay | Admitting: Emergency Medicine

## 2015-02-24 ENCOUNTER — Emergency Department (HOSPITAL_COMMUNITY)
Admission: EM | Admit: 2015-02-24 | Discharge: 2015-02-24 | Disposition: A | Discharge status: Home / Self Care | Payer: Medicaid Other | Attending: Emergency Medicine | Admitting: Emergency Medicine

## 2015-02-24 DIAGNOSIS — J209 Acute bronchitis, unspecified: Secondary | ICD-10-CM

## 2015-02-24 DIAGNOSIS — Z872 Personal history of diseases of the skin and subcutaneous tissue: Secondary | ICD-10-CM | POA: Insufficient documentation

## 2015-02-24 DIAGNOSIS — Z8742 Personal history of other diseases of the female genital tract: Secondary | ICD-10-CM | POA: Insufficient documentation

## 2015-02-24 DIAGNOSIS — Z79899 Other long term (current) drug therapy: Secondary | ICD-10-CM | POA: Insufficient documentation

## 2015-02-24 DIAGNOSIS — Z720 Tobacco use: Secondary | ICD-10-CM | POA: Diagnosis not present

## 2015-02-24 DIAGNOSIS — Z792 Long term (current) use of antibiotics: Secondary | ICD-10-CM | POA: Diagnosis not present

## 2015-02-24 DIAGNOSIS — Z8639 Personal history of other endocrine, nutritional and metabolic disease: Secondary | ICD-10-CM | POA: Diagnosis not present

## 2015-02-24 DIAGNOSIS — R0602 Shortness of breath: Secondary | ICD-10-CM | POA: Diagnosis present

## 2015-02-24 LAB — CBC WITH DIFFERENTIAL/PLATELET
BASOS PCT: 0 % (ref 0–1)
Basophils Absolute: 0 10*3/uL (ref 0.0–0.1)
EOS PCT: 3 % (ref 0–5)
Eosinophils Absolute: 0.3 10*3/uL (ref 0.0–0.7)
HCT: 39.4 % (ref 36.0–46.0)
HEMOGLOBIN: 13.1 g/dL (ref 12.0–15.0)
LYMPHS ABS: 2 10*3/uL (ref 0.7–4.0)
LYMPHS PCT: 18 % (ref 12–46)
MCH: 28.7 pg (ref 26.0–34.0)
MCHC: 33.2 g/dL (ref 30.0–36.0)
MCV: 86.2 fL (ref 78.0–100.0)
MONOS PCT: 12 % (ref 3–12)
Monocytes Absolute: 1.3 10*3/uL — ABNORMAL HIGH (ref 0.1–1.0)
NEUTROS ABS: 7.6 10*3/uL (ref 1.7–7.7)
Neutrophils Relative %: 67 % (ref 43–77)
PLATELETS: 227 10*3/uL (ref 150–400)
RBC: 4.57 MIL/uL (ref 3.87–5.11)
RDW: 13.3 % (ref 11.5–15.5)
WBC: 11.1 10*3/uL — ABNORMAL HIGH (ref 4.0–10.5)

## 2015-02-24 LAB — BASIC METABOLIC PANEL
ANION GAP: 11 (ref 5–15)
BUN: 18 mg/dL (ref 6–20)
CO2: 22 mmol/L (ref 22–32)
Calcium: 8.7 mg/dL — ABNORMAL LOW (ref 8.9–10.3)
Chloride: 107 mmol/L (ref 101–111)
Creatinine, Ser: 0.77 mg/dL (ref 0.44–1.00)
GFR calc Af Amer: >60 mL/min (ref >60)
Glucose, Bld: 112 mg/dL — ABNORMAL HIGH (ref 65–99)
POTASSIUM: 3.8 mmol/L (ref 3.5–5.1)
SODIUM: 140 mmol/L (ref 135–145)

## 2015-02-24 MED ORDER — ALBUTEROL SULFATE (2.5 MG/3ML) 0.083% IN NEBU
2.5000 mg | INHALATION_SOLUTION | Freq: Once | RESPIRATORY_TRACT | Status: AC
Start: 2015-02-24 — End: 2015-02-24
  Administered 2015-02-24: 2.5 mg via RESPIRATORY_TRACT
  Filled 2015-02-24: qty 3

## 2015-02-24 MED ORDER — PREDNISONE 10 MG (21) PO TBPK: 10.0000 mg | ORAL_TABLET | Freq: Every day | ORAL | Status: DC

## 2015-02-24 NOTE — ED Provider Notes (Signed)
CSN: [id number]     Arrival date & time 02/24/15  1820 History   First MD Initiated Contact with Patient 02/24/15 1939     Chief Complaint  Patient presents with  . Shortness of Breath  . CAP      (Consider location/radiation/quality/duration/timing/severity/associated sxs/prior Treatment) The history is provided by the patient and the spouse. No language interpreter was used.   Ms. Traci Walker is a 44 year old female with a history of PCOS and psoriasis who presents with chest pain, shortness of breath, and cough with small amounts of white sputum that began on Sunday night. She states that she was evaluated on Monday night in the ED and was given Tessalon for cough, Levaquin for possible early pneumonia, as well as an albuterol inhaler with PCP follow-up. Her cardiac workup was negative at that time. She states her symptoms have since worsened with congestion, shortness of breath on exertion, no improvement. He states she has been taking Mucinex, and NyQuil, and the medications prescribed to her in the ED with minimal relief. She states her husband is now sick and her son had strep 2 weeks ago. She denies any fever, hemoptysis, abdominal pain, nausea, vomiting, constipation, dysuria, hematuria, urinary frequency, leg swelling. He states she smokes half a pack of cigarettes per day. No history of asthma or COPD. Past Medical History  Diagnosis Date  . Toxemia in pregnancy   . PCOS (polycystic ovarian syndrome)   . Infertility, female     clomid use  . Psoriasis    Past Surgical History  Procedure Laterality Date  . Cholecystectomy    . Cesarean section    . Elbow surgery  2009    right elbow, release of nerve   Family History  Problem Relation Age of Onset  . Factor VIII deficiency Father   . Factor VIII deficiency Son    History  Substance Use Topics  . Smoking status: Current Every Day Smoker -- 0.50 packs/day    Types: Cigarettes  . Smokeless tobacco: Not on file  . Alcohol Use:  Yes     Comment: occasionally   OB History    Gravida Para Term Preterm AB TAB SAB Ectopic Multiple Living   Review of Systems  Constitutional: Negative for fever and chills.  Cardiovascular: Negative for leg swelling.  Neurological: Negative for dizziness.  All other systems reviewed and are negative.     Allergies  Codeine and Vicodin  Home Medications   Prior to Admission medications   Medication Sig Start Date End Date Taking? Authorizing Provider  benzonatate (TESSALON) 100 MG capsule Take 1 capsule (100 mg total) by mouth every 8 (eight) hours. Patient taking differently: Take 100 mg by mouth every 8 (eight) hours as needed for cough.  02/23/15  Yes Antony Madura, PA-C  ibuprofen (ADVIL,MOTRIN) 200 MG tablet Take 200 mg by mouth every 6 (six) hours as needed (for menstrual pain.). For pain   Yes Historical Provider, MD  ketorolac (TORADOL) 10 MG tablet Take 10 mg by mouth 3 (three) times daily. 02/11/15  Yes Historical Provider, MD  levofloxacin (LEVAQUIN) 250 MG tablet Take 3 tablets (750 mg total) by mouth daily. Your next dose is due on the morning of 02/24/15 02/23/15  Yes Antony Madura, PA-C  ondansetron (ZOFRAN) 4 MG tablet Take 4 mg by mouth every 6 (six) hours as needed for nausea or vomiting.  02/11/15  Yes Historical Provider, MD  oxyCODONE-acetaminophen (PERCOCET/ROXICET) 5-325 MG per tablet Take 1-2 tablets by mouth every 6 (six) hours as needed for moderate pain or severe pain.  02/11/15  Yes Historical Provider, MD  pantoprazole (PROTONIX) 20 MG tablet Take 1 tablet (20 mg total) by mouth daily. 02/23/15  Yes Antony Madura, PA-C  Phenyleph-Doxylamine-DM-APAP 5-6.25-10-325 MG/15ML LIQD Take 30 mLs by mouth as needed (cough).    Yes Historical Provider, MD  Phenylephrine-APAP-Guaifenesin 10-650-400 MG/20ML LIQD Take 20 mLs by mouth every 6 (six) hours as needed (for cold symptoms).   Yes Historical Provider, MD  predniSONE (STERAPRED UNI-PAK 21 TAB) 10 MG (21)  TBPK tablet Take 1 tablet (10 mg total) by mouth daily. Take 6 tabs by mouth daily  for 2 days, then 5 tabs for 2 days, then 4 tabs for 2 days, then 3 tabs for 2 days, 2 tabs for 2 days, then 1 tab by mouth daily for 2 days. 02/24/15   Clarise Chacko Patel-Mills, PA-C   BP 105/54 mmHg  Pulse 101  Temp(Src) 98.2 F (36.8 C) (Oral)  Resp 20  SpO2 100%  LMP 02/07/2015 (Exact Date) Physical Exam  Constitutional: She is oriented to person, place, and time. She appears well-developed and well-nourished.  HENT:  Head: Normocephalic and atraumatic.  Eyes: Conjunctivae are normal.  Neck: Normal range of motion. Neck supple.  Cardiovascular: Normal rate, regular rhythm and normal heart sounds.   Pulmonary/Chest: Effort normal and breath sounds normal.  Abdominal: Soft. There is no tenderness.  Musculoskeletal: Normal range of motion.  Neurological: She is alert and oriented to person, place, and time.  Skin: Skin is warm and dry.  Nursing note and vitals reviewed.   ED Course  Procedures (including critical care time) Labs Review Labs Reviewed  CBC WITH DIFFERENTIAL/PLATELET - Abnormal; Notable for the following:    WBC 11.1 (*)    Monocytes Absolute 1.3 (*)    All other components within normal limits  BASIC METABOLIC PANEL - Abnormal; Notable for the following:    Glucose, Bld 112 (*)    Calcium 8.7 (*)    All other components within normal limits    Imaging Review Dg Chest 2 View  02/24/2015   CLINICAL DATA:  Shortness of breath and cough.  EXAM: CHEST - 2 VIEW  COMPARISON:  02/23/2015  FINDINGS: Stable low lung volumes. Compared to the chest x-ray yesterday, there is mildly more prominent bronchial and interstitial prominence, especially in the left lung. This may be secondary to bronchitis. No focal airspace consolidation is seen. No pulmonary edema or pleural fluid. No pneumothorax. The visualized skeletal structures are unremarkable.  IMPRESSION: Increased bronchial and interstitial  prominence since yesterday's chest x-ray, especially in the left lung. This may be secondary to acute bronchitis.   Electronically Signed   By: Irish Lack M.D.   On: 02/24/2015 20:45    EKG Interpretation 02/24/15 18:28 Vent. rate 96 BPM PR interval 176 ms QRS duration 80 ms QT/QTc 330/417 ms P-R-T axes 41 60 43 Normal Sinus Rhythm I interpreted this EKG, Catha Gosselin, PA-C.  MDM   Final diagnoses:  Acute bronchitis, unspecified organism  Patient presents for chest pain, shortness of breath, cough since Sunday. He states the chest pain is due to coughing. He was evaluated in the ED for the same on Monday with negative cardiac workup. She was treated with Levaquin for possible pneumonia and given Tessalon for cough. She is currently afebrile and her vital signs are stable. Her WBCs decreased from 14 to 11  today. Her chest x-ray shows increased interstitial prominence since yesterday's chest x-ray especially in the left lung which may be secondary to acute bronchitis. She is non-hypoxic, in no acute distress, and can go home on prednisone. I told her to continue taking the Levaquin and to follow up with her PCP in 2 days. I gave her return precautions such as difficulty breathing, fever, chest pain, no improvement within 48 hours. Her and her husband verbally agree with the plan.      Catha GosselinHanna Patel-Mills, PA-C 02/25/15 0148  Arby BarretteMarcy Pfeiffer, MD 02/25/15 [phone]

## 2015-02-24 NOTE — Discharge Instructions (Signed)
Acute Bronchitis Up with your PCP in 2 days. Prednisone and antibiotic as prescribed. Return for fever, difficulty breathing, shortness of breath, chest pain. Bronchitis is inflammation of the airways that extend from the windpipe into the lungs (bronchi). The inflammation often causes mucus to develop. This leads to a cough, which is the most common symptom of bronchitis.  In acute bronchitis, the condition usually develops suddenly and goes away over time, usually in a couple weeks. Smoking, allergies, and asthma can make bronchitis worse. Repeated episodes of bronchitis may cause further lung problems.  CAUSES Acute bronchitis is most often caused by the same virus that causes a cold. The virus can spread from person to person (contagious) through coughing, sneezing, and touching contaminated objects. SIGNS AND SYMPTOMS   Cough.   Fever.   Coughing up mucus.   Body aches.   Chest congestion.   Chills.   Shortness of breath.   Sore throat.  DIAGNOSIS  Acute bronchitis is usually diagnosed through a physical exam. Your health care provider will also ask you questions about your medical history. Tests, such as chest X-rays, are sometimes done to rule out other conditions.  TREATMENT  Acute bronchitis usually goes away in a couple weeks. Oftentimes, no medical treatment is necessary. Medicines are sometimes given for relief of fever or cough. Antibiotic medicines are usually not needed but may be prescribed in certain situations. In some cases, an inhaler may be recommended to help reduce shortness of breath and control the cough. A cool mist vaporizer may also be used to help thin bronchial secretions and make it easier to clear the chest.  HOME CARE INSTRUCTIONS  Get plenty of rest.   Drink enough fluids to keep your urine clear or pale yellow (unless you have a medical condition that requires fluid restriction). Increasing fluids may help thin your respiratory secretions  (sputum) and reduce chest congestion, and it will prevent dehydration.   Take medicines only as directed by your health care provider.  If you were prescribed an antibiotic medicine, finish it all even if you start to feel better.  Avoid smoking and secondhand smoke. Exposure to cigarette smoke or irritating chemicals will make bronchitis worse. If you are a smoker, consider using nicotine gum or skin patches to help control withdrawal symptoms. Quitting smoking will help your lungs heal faster.   Reduce the chances of another bout of acute bronchitis by washing your hands frequently, avoiding people with cold symptoms, and trying not to touch your hands to your mouth, nose, or eyes.   Keep all follow-up visits as directed by your health care provider.  SEEK MEDICAL CARE IF: Your symptoms do not improve after 1 week of treatment.  SEEK IMMEDIATE MEDICAL CARE IF:  You develop an increased fever or chills.   You have chest pain.   You have severe shortness of breath.  You have bloody sputum.   You develop dehydration.  You faint or repeatedly feel like you are going to pass out.  You develop repeated vomiting.  You develop a severe headache. MAKE SURE YOU:   Understand these instructions.  Will watch your condition.  Will get help right away if you are not doing well or get worse. Document Released: 10/19/2004 Document Revised: 01/26/2014 Document Reviewed: 03/04/2013 [facility] [facility] Patient Information 2015 [employer], [place]. This information is not intended to replace advice given to you by your health care provider. Make sure you discuss any questions you have with your health care provider.

## 2015-02-24 NOTE — ED Notes (Signed)
Pt started having cold like symptoms on Sunday. Pt was seen here yesterday and was diagnosed with CAP. Pt started on Levaquin. Pt followed up with her PCP today and had blood work done and sent home. Pt states that she feels worse now than she did earlier, so her PCP recommended her to come in to ED for further eval.

## 2015-03-09 ENCOUNTER — Emergency Department (HOSPITAL_COMMUNITY): Payer: Medicaid Other

## 2015-03-09 ENCOUNTER — Encounter (HOSPITAL_COMMUNITY): Payer: Self-pay | Admitting: *Deleted

## 2015-03-09 ENCOUNTER — Emergency Department (HOSPITAL_COMMUNITY)
Admission: EM | Admit: 2015-03-09 | Discharge: 2015-03-09 | Disposition: A | Discharge status: Home / Self Care | Payer: Medicaid Other | Attending: Emergency Medicine | Admitting: Emergency Medicine

## 2015-03-09 DIAGNOSIS — Z8742 Personal history of other diseases of the female genital tract: Secondary | ICD-10-CM | POA: Insufficient documentation

## 2015-03-09 DIAGNOSIS — Z720 Tobacco use: Secondary | ICD-10-CM | POA: Insufficient documentation

## 2015-03-09 DIAGNOSIS — Z79899 Other long term (current) drug therapy: Secondary | ICD-10-CM | POA: Insufficient documentation

## 2015-03-09 DIAGNOSIS — Z791 Long term (current) use of non-steroidal anti-inflammatories (NSAID): Secondary | ICD-10-CM | POA: Insufficient documentation

## 2015-03-09 DIAGNOSIS — Z8639 Personal history of other endocrine, nutritional and metabolic disease: Secondary | ICD-10-CM | POA: Insufficient documentation

## 2015-03-09 DIAGNOSIS — Z7952 Long term (current) use of systemic steroids: Secondary | ICD-10-CM | POA: Insufficient documentation

## 2015-03-09 DIAGNOSIS — R079 Chest pain, unspecified: Secondary | ICD-10-CM | POA: Diagnosis present

## 2015-03-09 DIAGNOSIS — Z8701 Personal history of pneumonia (recurrent): Secondary | ICD-10-CM | POA: Insufficient documentation

## 2015-03-09 DIAGNOSIS — Z872 Personal history of diseases of the skin and subcutaneous tissue: Secondary | ICD-10-CM | POA: Insufficient documentation

## 2015-03-09 DIAGNOSIS — R0781 Pleurodynia: Secondary | ICD-10-CM

## 2015-03-09 DIAGNOSIS — Z792 Long term (current) use of antibiotics: Secondary | ICD-10-CM | POA: Insufficient documentation

## 2015-03-09 DIAGNOSIS — R0789 Other chest pain: Secondary | ICD-10-CM | POA: Insufficient documentation

## 2015-03-09 LAB — CBC
HCT: 40 % (ref 36.0–46.0)
Hemoglobin: 13.1 g/dL (ref 12.0–15.0)
MCH: 28.5 pg (ref 26.0–34.0)
MCHC: 32.8 g/dL (ref 30.0–36.0)
MCV: 87 fL (ref 78.0–100.0)
PLATELETS: 262 10*3/uL (ref 150–400)
RBC: 4.6 MIL/uL (ref 3.87–5.11)
RDW: 13.8 % (ref 11.5–15.5)
WBC: 13 10*3/uL — AB (ref 4.0–10.5)

## 2015-03-09 LAB — BASIC METABOLIC PANEL
ANION GAP: 5 (ref 5–15)
BUN: 22 mg/dL — ABNORMAL HIGH (ref 6–20)
CALCIUM: 8.3 mg/dL — AB (ref 8.9–10.3)
CHLORIDE: 112 mmol/L — AB (ref 101–111)
CO2: 23 mmol/L (ref 22–32)
Creatinine, Ser: 1.34 mg/dL — ABNORMAL HIGH (ref 0.44–1.00)
GFR calc non Af Amer: 48 mL/min — ABNORMAL LOW (ref >60)
GFR, EST AFRICAN AMERICAN: 56 mL/min — AB (ref >60)
Glucose, Bld: 116 mg/dL — ABNORMAL HIGH (ref 65–99)
Potassium: 3.8 mmol/L (ref 3.5–5.1)
Sodium: 140 mmol/L (ref 135–145)

## 2015-03-09 LAB — I-STAT TROPONIN, ED: TROPONIN I, POC: 0 ng/mL (ref 0.00–0.08)

## 2015-03-09 LAB — BRAIN NATRIURETIC PEPTIDE: B NATRIURETIC PEPTIDE 5: 26.2 pg/mL (ref 0.0–100.0)

## 2015-03-09 MED ORDER — KETOROLAC TROMETHAMINE 30 MG/ML IJ SOLN
30.0000 mg | Freq: Once | INTRAMUSCULAR | Status: AC
  Administered 2015-03-09: 30 mg via INTRAVENOUS
  Filled 2015-03-09: qty 1

## 2015-03-09 MED ORDER — SODIUM CHLORIDE 0.9 % IV BOLUS (SEPSIS)
1000.0000 mL | Freq: Once | INTRAVENOUS | Status: AC
  Administered 2015-03-09: 1000 mL via INTRAVENOUS

## 2015-03-09 MED ORDER — TRAMADOL HCL 50 MG PO TABS: 50.0000 mg | ORAL_TABLET | Freq: Four times a day (QID) | ORAL | Status: DC | PRN

## 2015-03-09 MED ORDER — IBUPROFEN 800 MG PO TABS: 800.0000 mg | ORAL_TABLET | Freq: Four times a day (QID) | ORAL | Status: AC | PRN

## 2015-03-09 NOTE — Discharge Instructions (Signed)
Return here as needed.  Follow-up with your primary care doctor.  Your testing here today does not show any significant abnormality

## 2015-03-09 NOTE — ED Notes (Signed)
Pt reports central cp radiates to the R side of her rib and back since last night. Pt describes pain as sharp tight pain with SOB. Pt reports laying down makes the pain a little better.  Pt is a smoker.  Reports having same pain x 2 weeks ago which she was dx with PNA.

## 2015-03-09 NOTE — ED Provider Notes (Signed)
CSN: [id number]     Arrival date & time 03/09/15  1914 History   First MD Initiated Contact with Patient 03/09/15 1936     Chief Complaint  Patient presents with  . Chest Pain     (Consider location/radiation/quality/duration/timing/severity/associated sxs/prior Treatment) HPI Patient presents to the emergency department with right-sided chest pain that radiates to the lateral chest and right side of her back.  Patient states that she has had a recent pneumonia which is caused her to have a lot of coughing.  The patient states that he is a smoker of half a pack a day.  She states that she did not finish the course of anabiotic's.  She was also prescribed inhalers, steroid-induced and a cough suppressant.  The patient states that she has had no nausea, vomiting, weakness, dizziness, headache, blurred vision, back pain, neck pain, fever, runny nose, sore throat, rash, or syncope.  The patient states that movement and deep breathing causes the pain to be worse Past Medical History  Diagnosis Date  . Toxemia in pregnancy   . PCOS (polycystic ovarian syndrome)   . Infertility, female     clomid use  . Psoriasis    Past Surgical History  Procedure Laterality Date  . Cholecystectomy    . Cesarean section    . Elbow surgery  2009    right elbow, release of nerve   Family History  Problem Relation Age of Onset  . Factor VIII deficiency Father   . Factor VIII deficiency Son    History  Substance Use Topics  . Smoking status: Current Every Day Smoker -- 0.50 packs/day    Types: Cigarettes  . Smokeless tobacco: Not on file  . Alcohol Use: Yes     Comment: occasionally   OB History    Gravida Para Term Preterm AB TAB SAB Ectopic Multiple Living   Review of Systems  All other systems negative except as documented in the HPI. All pertinent positives and negatives as reviewed in the HPI.=  Allergies  Codeine and Vicodin  Home Medications   Prior to Admission  medications   Medication Sig Start Date End Date Taking? Authorizing Provider  albuterol (PROVENTIL) (2.5 MG/3ML) 0.083% nebulizer solution 3 ML AS NEEDED EVERY 6 HRS AS NEEDED INHALATION 03/02/15  Yes Historical Provider, MD  meloxicam (MOBIC) 15 MG tablet Take 15 mg by mouth daily.   Yes Historical Provider, MD  oxyCODONE-acetaminophen (PERCOCET/ROXICET) 5-325 MG per tablet Take 1-2 tablets by mouth every 6 (six) hours as needed for moderate pain or severe pain (pain).  02/11/15  Yes Historical Provider, MD  benzonatate (TESSALON) 100 MG capsule Take 1 capsule (100 mg total) by mouth every 8 (eight) hours. Patient not taking: Reported on 03/09/2015 02/23/15   Antony Madura, PA-C  levofloxacin (LEVAQUIN) 250 MG tablet Take 3 tablets (750 mg total) by mouth daily. Your next dose is due on the morning of 02/24/15 Patient not taking: Reported on 03/09/2015 02/23/15   Antony Madura, PA-C  pantoprazole (PROTONIX) 20 MG tablet Take 1 tablet (20 mg total) by mouth daily. Patient not taking: Reported on 03/09/2015 02/23/15   Antony Madura, PA-C  predniSONE (STERAPRED UNI-PAK 21 TAB) 10 MG (21) TBPK tablet Take 1 tablet (10 mg total) by mouth daily. Take 6 tabs by mouth daily  for 2 days, then 5 tabs for 2 days, then 4 tabs for 2 days, then 3 tabs for  2 days, 2 tabs for 2 days, then 1 tab by mouth daily for 2 days. Patient not taking: Reported on 03/09/2015 02/24/15   Catha Gosselin, PA-C   BP 111/74 mmHg  Pulse 71  Temp(Src) 98.1 F (36.7 C) (Oral)  Resp 20  SpO2 99%  LMP 03/09/2015 Physical Exam  Constitutional: She is oriented to person, place, and time. She appears well-developed and well-nourished. No distress.  HENT:  Head: Normocephalic and atraumatic.  Mouth/Throat: Oropharynx is clear and moist.  Eyes: Pupils are equal, round, and reactive to light.  Neck: Normal range of motion. Neck supple.  Cardiovascular: Normal rate, regular rhythm and normal heart sounds.  Exam reveals no gallop and no friction  rub.   No murmur heard. Pulmonary/Chest: Effort normal and breath sounds normal. No respiratory distress. She exhibits no tenderness.  Neurological: She is alert and oriented to person, place, and time. She exhibits normal muscle tone. Coordination normal.  Skin: Skin is warm. No rash noted. No erythema.  Psychiatric: She has a normal mood and affect. Her behavior is normal.  Nursing note and vitals reviewed.   ED Course  Procedures (including critical care time) Labs Review Labs Reviewed  CBC - Abnormal; Notable for the following:    WBC 13.0 (*)    All other components within normal limits  BASIC METABOLIC PANEL - Abnormal; Notable for the following:    Chloride 112 (*)    Glucose, Bld 116 (*)    BUN 22 (*)    Creatinine, Ser 1.34 (*)    Calcium 8.3 (*)    GFR calc non Af Amer 48 (*)    GFR calc Af Amer 56 (*)    All other components within normal limits  BRAIN NATRIURETIC PEPTIDE  I-STAT TROPOININ, ED    Imaging Review Dg Chest 2 View  03/09/2015   CLINICAL DATA:  Upper chest pain radiating to the RIGHT arm for 24 hours. Shortness of breath.  EXAM: CHEST  2 VIEW  COMPARISON:  02/24/2015.  FINDINGS: Cardiopericardial silhouette within normal limits. Mediastinal contours normal. Trachea midline. No airspace disease or effusion. Monitoring leads project over the chest.  IMPRESSION: No active cardiopulmonary disease.   Electronically Signed   By: Andreas Newport M.D.   On: 03/09/2015 21:51     EKG Interpretation   Date/Time:  Tuesday March 09 2015 19:21:03 EDT Ventricular Rate:  91 PR Interval:  157 QRS Duration: 82 QT Interval:  354 QTC Calculation: 435 R Axis:   52 Text Interpretation:  Sinus rhythm Low voltage, precordial leads No  significant change since last tracing Confirmed by Ethelda Chick  MD, SAM  [phone]) on 03/09/2015 9:37:00 PM      The patient will be treated for pleuritic chest pain.  Patient thinks she had a recent pneumonia and significant coughing.  The  patient is advised follow-up with her primary care Dr. told to return here as needed    Charlestine Night, PA-C 03/09/15 2308  Doug Sou, MD 03/10/15 [phone]

## 2016-04-09 ENCOUNTER — Encounter (HOSPITAL_COMMUNITY): Payer: Self-pay | Admitting: Oncology

## 2016-04-09 ENCOUNTER — Emergency Department (HOSPITAL_COMMUNITY): Payer: Medicaid Other

## 2016-04-09 ENCOUNTER — Emergency Department (HOSPITAL_COMMUNITY)
Admission: EM | Admit: 2016-04-09 | Discharge: 2016-04-09 | Disposition: A | Discharge status: Home / Self Care | Payer: Medicaid Other | Attending: Emergency Medicine | Admitting: Emergency Medicine

## 2016-04-09 DIAGNOSIS — F17210 Nicotine dependence, cigarettes, uncomplicated: Secondary | ICD-10-CM | POA: Insufficient documentation

## 2016-04-09 DIAGNOSIS — Y929 Unspecified place or not applicable: Secondary | ICD-10-CM | POA: Insufficient documentation

## 2016-04-09 DIAGNOSIS — W2203XA Walked into furniture, initial encounter: Secondary | ICD-10-CM | POA: Diagnosis not present

## 2016-04-09 DIAGNOSIS — Y999 Unspecified external cause status: Secondary | ICD-10-CM | POA: Insufficient documentation

## 2016-04-09 DIAGNOSIS — S99921A Unspecified injury of right foot, initial encounter: Secondary | ICD-10-CM | POA: Insufficient documentation

## 2016-04-09 DIAGNOSIS — Y939 Activity, unspecified: Secondary | ICD-10-CM | POA: Diagnosis not present

## 2016-04-09 MED ORDER — IBUPROFEN 800 MG PO TABS
800.0000 mg | ORAL_TABLET | Freq: Once | ORAL | Status: AC
  Administered 2016-04-09: 800 mg via ORAL
  Filled 2016-04-09: qty 1

## 2016-04-09 NOTE — ED Notes (Signed)
Pt stubbed her right foot x 2 injuring her third and fourth toe.  Pt rates pain 8/10.  Ambulatory in triage.

## 2016-04-09 NOTE — ED Provider Notes (Signed)
CSN: [id number]     Arrival date & time 04/09/16  1957 History   First MD Initiated Contact with Patient 04/09/16 2124     Chief Complaint  Patient presents with  . Foot Injury     (Consider location/radiation/quality/duration/timing/severity/associated sxs/prior Treatment) HPI   Traci Walker is a 45 y.o. female, patient with no pertinent past medical history, presenting to the ED with right third and fourth toe pain following an injury that occurred this afternoon. Patient states that she accidentally ran her foot into a piece of furniture. Has not taken anything for the pain. Denies other injuries or complaints.      Past Medical History  Diagnosis Date  . Toxemia in pregnancy   . PCOS (polycystic ovarian syndrome)   . Infertility, female     clomid use  . Psoriasis    Past Surgical History  Procedure Laterality Date  . Cholecystectomy    . Cesarean section    . Elbow surgery  2009    right elbow, release of nerve   Family History  Problem Relation Age of Onset  . Factor VIII deficiency Father   . Factor VIII deficiency Son    Social History  Substance Use Topics  . Smoking status: Current Every Day Smoker -- 0.50 packs/day    Types: Cigarettes  . Smokeless tobacco: None  . Alcohol Use: Yes     Comment: occasionally   OB History    Gravida Para Term Preterm AB TAB SAB Ectopic Multiple Living   Review of Systems  Musculoskeletal: Positive for arthralgias.  Neurological: Negative for weakness and numbness.      Allergies  Codeine and Vicodin  Home Medications   Prior to Admission medications   Medication Sig Start Date End Date Taking? Authorizing Provider  albuterol (PROVENTIL) (2.5 MG/3ML) 0.083% nebulizer solution 3 ML AS NEEDED EVERY 6 HRS AS NEEDED INHALATION 03/02/15   Historical Provider, MD  benzonatate (TESSALON) 100 MG capsule Take 1 capsule (100 mg total) by mouth every 8 (eight) hours. Patient not taking: Reported on  03/09/2015 02/23/15   Antony Madura, PA-C  ibuprofen (ADVIL,MOTRIN) 800 MG tablet Take 1 tablet (800 mg total) by mouth every 6 (six) hours as needed. 03/09/15   Charlestine Night, PA-C  levofloxacin (LEVAQUIN) 250 MG tablet Take 3 tablets (750 mg total) by mouth daily. Your next dose is due on the morning of 02/24/15 Patient not taking: Reported on 03/09/2015 02/23/15   Antony Madura, PA-C  meloxicam (MOBIC) 15 MG tablet Take 15 mg by mouth daily.    Historical Provider, MD  oxyCODONE-acetaminophen (PERCOCET/ROXICET) 5-325 MG per tablet Take 1-2 tablets by mouth every 6 (six) hours as needed for moderate pain or severe pain (pain).  02/11/15   Historical Provider, MD  pantoprazole (PROTONIX) 20 MG tablet Take 1 tablet (20 mg total) by mouth daily. Patient not taking: Reported on 03/09/2015 02/23/15   Antony Madura, PA-C  predniSONE (STERAPRED UNI-PAK 21 TAB) 10 MG (21) TBPK tablet Take 1 tablet (10 mg total) by mouth daily. Take 6 tabs by mouth daily  for 2 days, then 5 tabs for 2 days, then 4 tabs for 2 days, then 3 tabs for 2 days, 2 tabs for 2 days, then 1 tab by mouth daily for 2 days. Patient not taking: Reported on 03/09/2015 02/24/15   Catha Gosselin, PA-C  traMADol (ULTRAM) 50 MG tablet Take 1 tablet (50 mg  total) by mouth every 6 (six) hours as needed. 03/09/15   Christopher Lawyer, PA-C   BP 120/86 mmHg  Pulse 77  Temp(Src) 98.6 F (37 C) (Oral)  Resp 15  Ht 5\' 4"  (1.626 m)  Wt 104.327 kg  BMI 39.46 kg/m2  SpO2 98%  LMP 04/06/2016 (Exact Date) Physical Exam  Constitutional: She appears well-developed and well-nourished. No distress.  HENT:  Head: Normocephalic and atraumatic.  Eyes: Conjunctivae are normal.  Neck: Neck supple.  Cardiovascular: Normal rate and regular rhythm.   Pulmonary/Chest: Effort normal.  Musculoskeletal:  Tenderness to the right third and fourth toes. No discernible swelling or deformity. No tenderness to the bones of the foot. Full range of motion in right foot and  toes.  Neurological: She is alert.  No sensory deficits in the right foot or toes. Strength is 5 out of 5.  Skin: Skin is warm and dry. She is not diaphoretic.  Psychiatric: She has a normal mood and affect. Her behavior is normal.  Nursing note and vitals reviewed.   ED Course  Procedures (including critical care time)  Imaging Review Dg Foot Complete Right  04/09/2016  CLINICAL DATA:  Stubbed right foot injuring third and fourth toes. Initial encounter. EXAM: RIGHT FOOT COMPLETE - 3+ VIEW COMPARISON:  None. FINDINGS: There is no evidence of fracture or dislocation. Soft tissues are unremarkable. IMPRESSION: Negative. Electronically Signed   By: Marnee SpringJonathon  Watts M.D.   On: 04/09/2016 22:08   I have personally reviewed and evaluated these images as part of my medical decision-making.   EKG Interpretation None      MDM   Final diagnoses:  Toe injury, right, initial encounter    Traci Walker presents with a right third and fourth toe injury occurring this afternoon.  No abnormalities on x-ray. Postop shoe and crutches for comfort. Patient already has an upcoming appointment with Dr. Dion SaucierLandau, orthopedist, for a separate issue. Patient encouraged to address her toe pain at that time, if necessary.     Anselm PancoastShawn C Brinden Kincheloe, PA-C 04/09/16 2213  Tilden FossaElizabeth Rees, MD 04/10/16 1450

## 2016-04-09 NOTE — ED Notes (Signed)
PT DISCHARGED. INSTRUCTIONS GIVEN. AAOX4. PT IN NO APPARENT DISTRESS. THE OPPORTUNITY TO ASK QUESTIONS WAS PROVIDED. 

## 2016-04-09 NOTE — Discharge Instructions (Signed)
You have been seen today for a toe injury. Your imaging showed no abnormalities. Follow up with PCP as needed should symptoms continue. Naproxen or ibuprofen for pain. Elevate the extremity whenever possible and apply ice to reduce inflammation and pain.

## 2016-08-18 IMAGING — CR DG FOOT COMPLETE 3+V*R*
3 series · 3 of 3 positions shown · non-contrast
Comparison: None.

CLINICAL DATA: Stubbed right foot injuring third and fourth toes.
Initial encounter.

EXAM:
RIGHT FOOT COMPLETE - 3+ VIEW

[x foot ap right]
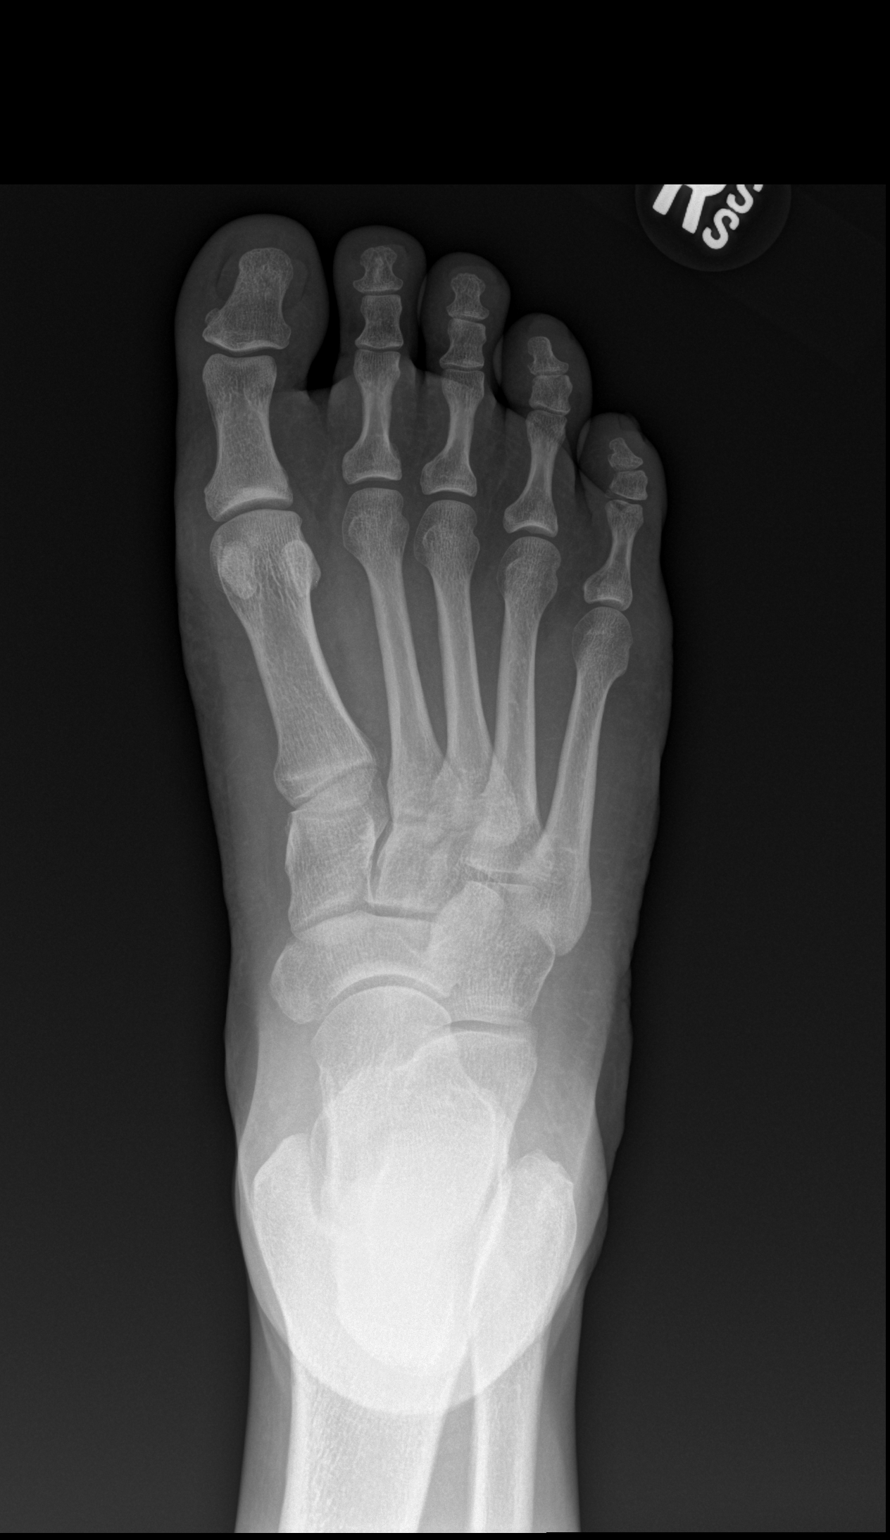

[x foot obl right]
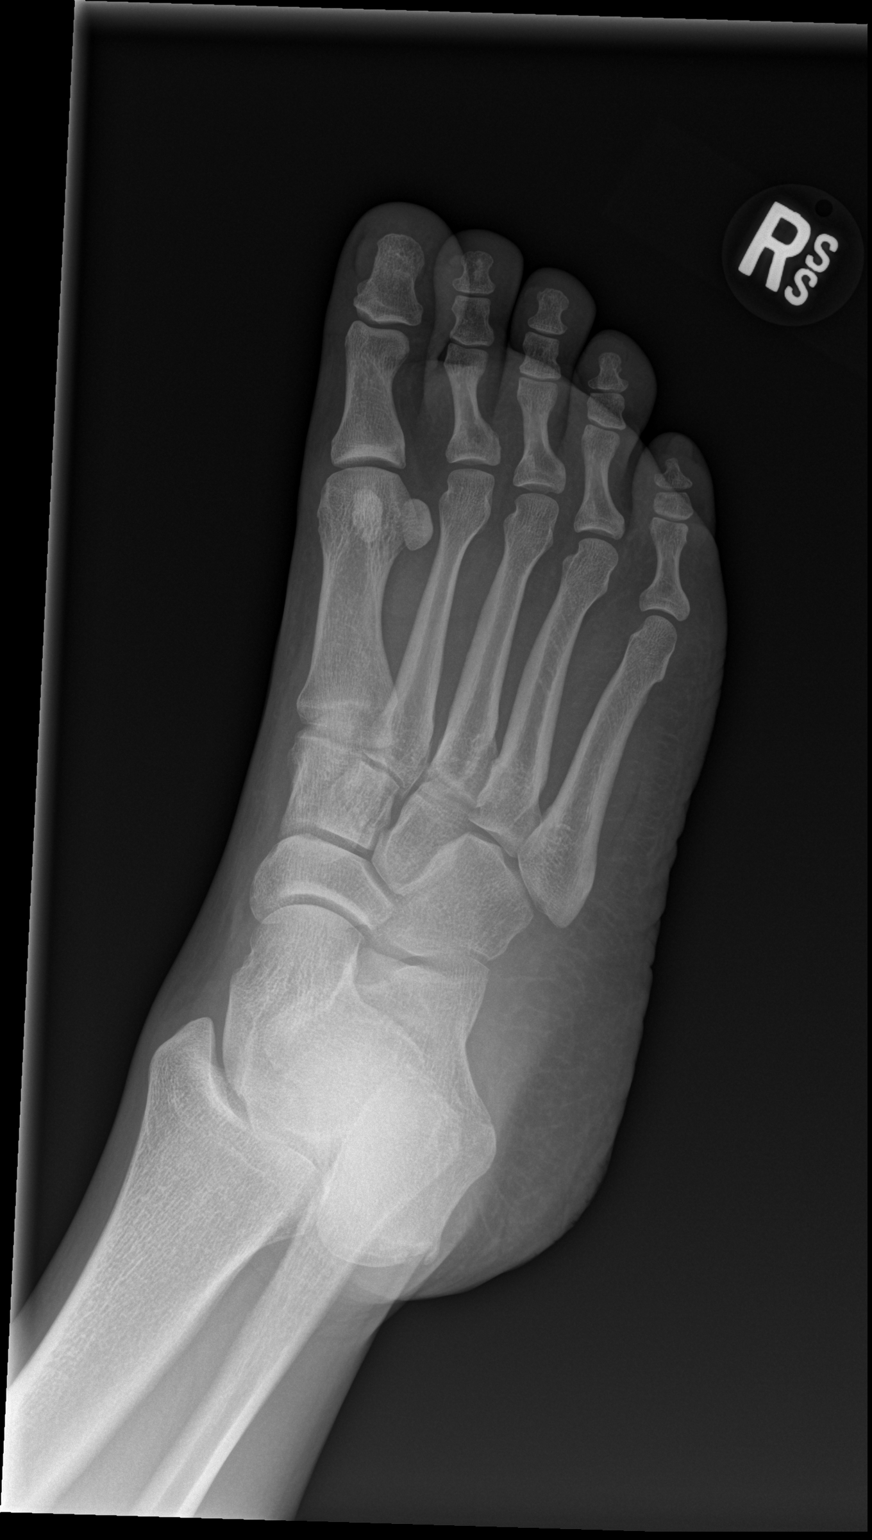

[x foot lat right]
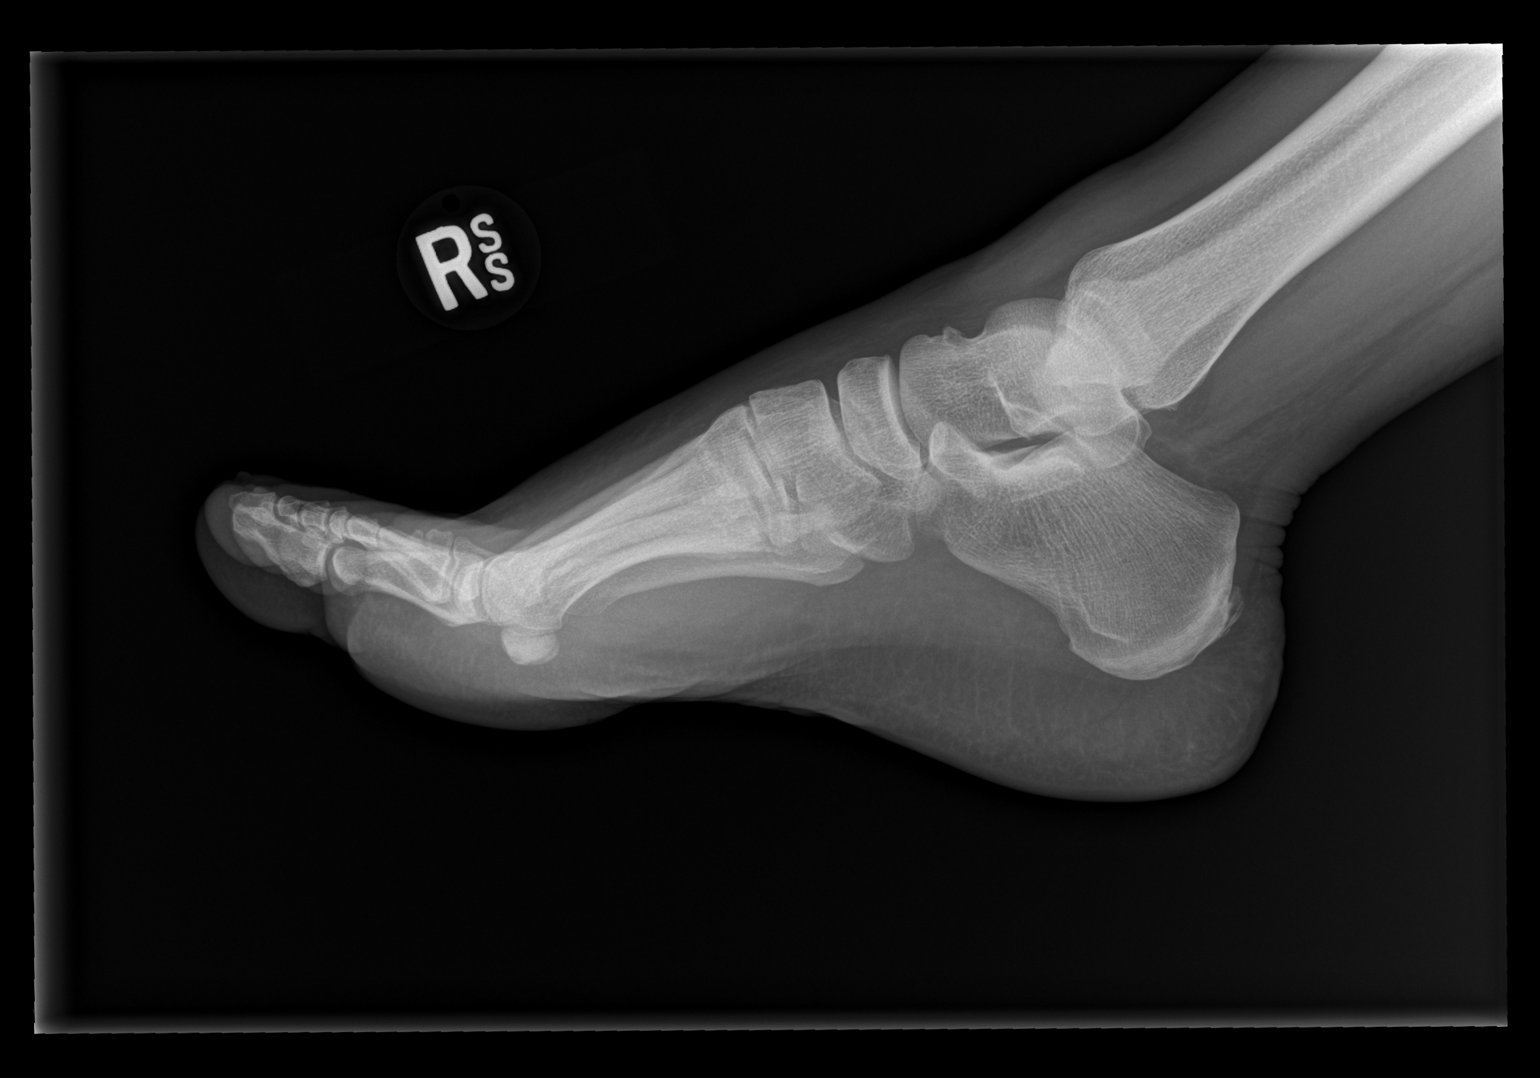

[3 of 3 positions shown; findings below may reference images not displayed]

FINDINGS: There is no evidence of fracture or dislocation.

Soft tissues are unremarkable.
IMPRESSION: Negative.

## 2016-12-19 ENCOUNTER — Other Ambulatory Visit: Payer: Self-pay | Admitting: Nurse Practitioner

## 2016-12-19 ENCOUNTER — Other Ambulatory Visit: Payer: Self-pay | Admitting: Certified Nurse Midwife

## 2016-12-19 ENCOUNTER — Other Ambulatory Visit (HOSPITAL_COMMUNITY)
Admission: RE | Admit: 2016-12-19 | Discharge: 2016-12-19 | Disposition: A | Discharge status: Home / Self Care | Payer: Medicaid Other | Source: Ambulatory Visit | Attending: Nurse Practitioner | Admitting: Nurse Practitioner

## 2016-12-19 DIAGNOSIS — Z1151 Encounter for screening for human papillomavirus (HPV): Secondary | ICD-10-CM | POA: Diagnosis present

## 2016-12-19 DIAGNOSIS — Z01419 Encounter for gynecological examination (general) (routine) without abnormal findings: Secondary | ICD-10-CM | POA: Insufficient documentation

## 2016-12-19 DIAGNOSIS — Z1231 Encounter for screening mammogram for malignant neoplasm of breast: Secondary | ICD-10-CM

## 2016-12-21 LAB — CYTOLOGY - PAP
Diagnosis: NEGATIVE
HPV: NOT DETECTED

## 2017-01-05 ENCOUNTER — Ambulatory Visit
Admission: RE | Admit: 2017-01-05 | Discharge: 2017-01-05 | Disposition: A | Discharge status: Home / Self Care | Payer: Medicaid Other | Source: Ambulatory Visit | Attending: Nurse Practitioner | Admitting: Nurse Practitioner

## 2017-01-05 DIAGNOSIS — Z1231 Encounter for screening mammogram for malignant neoplasm of breast: Secondary | ICD-10-CM

## 2019-12-12 ENCOUNTER — Encounter: Payer: Self-pay | Admitting: Certified Nurse Midwife

## 2021-01-06 ENCOUNTER — Encounter (HOSPITAL_BASED_OUTPATIENT_CLINIC_OR_DEPARTMENT_OTHER): Payer: Self-pay | Admitting: *Deleted

## 2021-01-06 ENCOUNTER — Other Ambulatory Visit: Payer: Self-pay

## 2021-01-06 ENCOUNTER — Emergency Department (HOSPITAL_BASED_OUTPATIENT_CLINIC_OR_DEPARTMENT_OTHER)
Admission: EM | Admit: 2021-01-06 | Discharge: 2021-01-06 | Disposition: A | Discharge status: Home / Self Care | Payer: Managed Care, Other (non HMO) | Attending: Emergency Medicine | Admitting: Emergency Medicine

## 2021-01-06 DIAGNOSIS — F17210 Nicotine dependence, cigarettes, uncomplicated: Secondary | ICD-10-CM | POA: Insufficient documentation

## 2021-01-06 DIAGNOSIS — M5442 Lumbago with sciatica, left side: Secondary | ICD-10-CM | POA: Insufficient documentation

## 2021-01-06 DIAGNOSIS — M545 Low back pain, unspecified: Secondary | ICD-10-CM | POA: Diagnosis present

## 2021-01-06 MED ORDER — METHOCARBAMOL 500 MG PO TABS: 500.0000 mg | ORAL_TABLET | Freq: Three times a day (TID) | ORAL | 0 refills | Status: DC | PRN

## 2021-01-06 MED ORDER — KETOROLAC TROMETHAMINE 15 MG/ML IJ SOLN
30.0000 mg | Freq: Once | INTRAMUSCULAR | Status: AC
Start: 2021-01-06 — End: 2021-01-06
  Administered 2021-01-06: 30 mg via INTRAMUSCULAR
  Filled 2021-01-06: qty 2

## 2021-01-06 MED ORDER — PREDNISONE 20 MG PO TABS: 40.0000 mg | ORAL_TABLET | Freq: Every day | ORAL | 0 refills | Status: DC
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
## 2021-01-06 NOTE — ED Triage Notes (Signed)
Chronic back pain and is getting worst yesterday since she lifted a 35 lbs water

## 2021-01-06 NOTE — ED Notes (Signed)
ED Provider at bedside. 

## 2021-07-05 ENCOUNTER — Encounter (HOSPITAL_BASED_OUTPATIENT_CLINIC_OR_DEPARTMENT_OTHER): Payer: Self-pay | Admitting: *Deleted

## 2021-07-05 ENCOUNTER — Emergency Department (HOSPITAL_BASED_OUTPATIENT_CLINIC_OR_DEPARTMENT_OTHER)
Admission: EM | Admit: 2021-07-05 | Discharge: 2021-07-05 | Disposition: A | Discharge status: Home / Self Care | Payer: Managed Care, Other (non HMO) | Attending: Emergency Medicine | Admitting: Emergency Medicine

## 2021-07-05 ENCOUNTER — Other Ambulatory Visit: Payer: Self-pay

## 2021-07-05 DIAGNOSIS — M5441 Lumbago with sciatica, right side: Secondary | ICD-10-CM

## 2021-07-05 DIAGNOSIS — M5442 Lumbago with sciatica, left side: Secondary | ICD-10-CM | POA: Insufficient documentation

## 2021-07-05 DIAGNOSIS — M545 Low back pain, unspecified: Secondary | ICD-10-CM | POA: Diagnosis present

## 2021-07-05 DIAGNOSIS — F17210 Nicotine dependence, cigarettes, uncomplicated: Secondary | ICD-10-CM | POA: Diagnosis not present

## 2021-07-05 MED ORDER — PREDNISONE 50 MG PO TABS
60.0000 mg | ORAL_TABLET | Freq: Once | ORAL | Status: AC
  Administered 2021-07-05: 60 mg via ORAL
  Filled 2021-07-05: qty 1

## 2021-07-05 MED ORDER — METHOCARBAMOL 500 MG PO TABS: 500.0000 mg | ORAL_TABLET | Freq: Three times a day (TID) | ORAL | 0 refills | Status: AC | PRN

## 2021-07-05 MED ORDER — PREDNISONE 20 MG PO TABS: 40.0000 mg | ORAL_TABLET | Freq: Every day | ORAL | 0 refills | Status: AC

## 2021-07-05 MED ORDER — KETOROLAC TROMETHAMINE 15 MG/ML IJ SOLN
30.0000 mg | Freq: Once | INTRAMUSCULAR | Status: AC
  Administered 2021-07-05: 30 mg via INTRAMUSCULAR
  Filled 2021-07-05: qty 2

## 2021-07-05 NOTE — ED Notes (Signed)
This RN presented the AVS utilizing Teachback Method. Patient verbalizes understanding of Discharge Instructions. Opportunity for Questioning and Answers were provided. Patient Discharged from ED ambulatory to Home with Family  

## 2021-07-05 NOTE — ED Provider Notes (Signed)
[facility] EMERGENCY DEPT Provider Note   CSN: [id number] Arrival date & time: 07/05/21  1706     History Chief Complaint  Patient presents with   Back Pain   Hip Pain    Traci Walker is a 50 y.o. female.  The history is provided by the patient.  Back Pain Associated symptoms: no abdominal pain, no chest pain, no dysuria, no numbness and no weakness   Hip Pain Pertinent negatives include no chest pain, no abdominal pain and no shortness of breath. Patient presents with somewhat acute on chronic low back pain.  It is in the low back particularly in the right back/hip area.  Some radiation to both legs.  States it is worse when she sits down.  States she does okay standing.  Had been seen in the ER for similar symptoms around 6 months ago.  Had been doing better but never completely improved.  Has tried various treatments at home without consistent relief.  No fevers or chills.  No dysuria.  No cancer history.  No IV drug use history.     Past Medical History:  Diagnosis Date   Infertility, female    clomid use   PCOS (polycystic ovarian syndrome)    Psoriasis    Toxemia in pregnancy     There are no problems to display for this patient.   Past Surgical History:  Procedure Laterality Date   CESAREAN SECTION     CHOLECYSTECTOMY     ELBOW SURGERY  2009   right elbow, release of nerve   KNEE SURGERY     Left 4-5 years ago     OB History     Gravida  1   Para  1   Term  1   Preterm      AB      Living  1      SAB      IAB      Ectopic      Multiple      Live Births  1           Family History  Problem Relation Age of Onset   Factor VIII deficiency Father    Factor VIII deficiency Son     Social History   Tobacco Use   Smoking status: Every Day    Packs/day: 0.50    Types: Cigarettes   Smokeless tobacco: Never  Vaping Use   Vaping Use: Every day  Substance Use Topics   Alcohol use: Yes    Comment: occasionally    Drug use: No    Home Medications Prior to Admission medications   Medication Sig Start Date End Date Taking? Authorizing Provider  ibuprofen (ADVIL,MOTRIN) 800 MG tablet Take 1 tablet (800 mg total) by mouth every 6 (six) hours as needed. 03/09/15   Lawyer, Cristal Deer, PA-C  methocarbamol (ROBAXIN) 500 MG tablet Take 1 tablet (500 mg total) by mouth every 8 (eight) hours as needed for muscle spasms. 07/05/21   Benjiman Core, MD  predniSONE (DELTASONE) 20 MG tablet Take 2 tablets (40 mg total) by mouth daily. 07/05/21   Benjiman Core, MD    Allergies    Codeine and Vicodin [hydrocodone-acetaminophen]  Review of Systems   Review of Systems  Constitutional:  Negative for appetite change.  Respiratory:  Negative for shortness of breath.   Cardiovascular:  Negative for chest pain.  Gastrointestinal:  Negative for abdominal pain.  Genitourinary:  Negative for dysuria and flank pain.  Musculoskeletal:  Positive for back pain. Negative for neck pain.  Skin:  Negative for rash and wound.  Neurological:  Negative for weakness, light-headedness and numbness.  Psychiatric/Behavioral:  Negative for confusion.    Physical Exam Updated Vital Signs BP (!) 123/92 (BP Location: Right Arm)   Pulse 78   Temp 98.3 F (36.8 C)   Resp 18   Ht 5\' 4"  (1.626 m)   Wt 90.3 kg   SpO2 99%   BMI 34.16 kg/m   Physical Exam Vitals reviewed.  HENT:     Head: Atraumatic.  Cardiovascular:     Rate and Rhythm: Regular rhythm.  Pulmonary:     Breath sounds: Normal breath sounds.  Abdominal:     Tenderness: There is no abdominal tenderness.  Musculoskeletal:        General: Tenderness present.     Cervical back: Neck supple.     Comments: Tenderness and right lower back/SI area.  No rash.  Pain with straight leg raise on right but gets up to close to 45 degrees before it happens.  Neuro vas intact in bilateral feet.  Skin:    General: Skin is warm.     Capillary Refill: Capillary refill takes  less than 2 seconds.  Neurological:     Mental Status: She is alert and oriented to person, place, and time.    ED Results / Procedures / Treatments   Labs (all labs ordered are listed, but only abnormal results are displayed) Labs Reviewed - No data to display  EKG None  Radiology No results found.  Procedures Procedures   Medications Ordered in ED Medications  ketorolac (TORADOL) 15 MG/ML injection 30 mg (30 mg Intramuscular Given 07/05/21 2134)  predniSONE (DELTASONE) tablet 60 mg (60 mg Oral Given 07/05/21 2133)    ED Course  I have reviewed the triage vital signs and the nursing notes.  Pertinent labs & imaging results that were available during my care of the patient were reviewed by me and considered in my medical decision making (see chart for details).    MDM Rules/Calculators/A&P                           Patient with back pain.  Acute on chronic.  No acute injury.  No red flags.  Will treat with steroids and muscle relaxers.  Neurosurgery follow-up as needed.  No rash.  Appears stable for discharge Final Clinical Impression(s) / ED Diagnoses Final diagnoses:  Bilateral low back pain with right-sided sciatica, unspecified chronicity    Rx / DC Orders ED Discharge Orders          Ordered    methocarbamol (ROBAXIN) 500 MG tablet  Every 8 hours PRN        07/05/21 2148    predniSONE (DELTASONE) 20 MG tablet  Daily        07/05/21 2148             2149, MD 07/05/21 2148

## 2021-07-05 NOTE — ED Triage Notes (Signed)
Pt has had pain in lower back for months and seen in the ED for it, about 4 days ago rt hip also started to hurt, no noted injury, difficult to move leg at times due to pain.

## 2021-08-13 ENCOUNTER — Ambulatory Visit: Payer: Managed Care, Other (non HMO) | Attending: Neurosurgery | Admitting: Physical Therapy

## 2021-08-13 ENCOUNTER — Other Ambulatory Visit: Payer: Self-pay

## 2021-08-13 ENCOUNTER — Encounter: Payer: Self-pay | Admitting: Physical Therapy

## 2021-08-13 DIAGNOSIS — G8929 Other chronic pain: Secondary | ICD-10-CM | POA: Diagnosis present

## 2021-08-13 DIAGNOSIS — M6281 Muscle weakness (generalized): Secondary | ICD-10-CM | POA: Diagnosis present

## 2021-08-13 DIAGNOSIS — M545 Low back pain, unspecified: Secondary | ICD-10-CM | POA: Diagnosis present

## 2021-08-13 DIAGNOSIS — R2689 Other abnormalities of gait and mobility: Secondary | ICD-10-CM | POA: Diagnosis present

## 2021-08-13 NOTE — Patient Instructions (Addendum)
  Access Code: [email or web address] URL: [email or web address] Date: 08/13/2021 Prepared by: Alphonzo Severance  Exercises Supine Posterior Pelvic Tilt - 2 x daily - 7 x weekly - 2 sets - 10 reps - 5'' hold Supine Bridge - 1 x daily - 7 x weekly - 3 sets - 10 reps
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
## 2021-08-23 ENCOUNTER — Ambulatory Visit: Payer: Managed Care, Other (non HMO) | Admitting: Physical Therapy

## 2021-08-23 ENCOUNTER — Encounter: Payer: Self-pay | Admitting: Physical Therapy

## 2021-08-23 ENCOUNTER — Other Ambulatory Visit: Payer: Self-pay

## 2021-08-23 DIAGNOSIS — M545 Low back pain, unspecified: Secondary | ICD-10-CM | POA: Diagnosis not present

## 2021-08-23 DIAGNOSIS — M6281 Muscle weakness (generalized): Secondary | ICD-10-CM

## 2021-08-23 DIAGNOSIS — G8929 Other chronic pain: Secondary | ICD-10-CM

## 2021-08-23 DIAGNOSIS — R2689 Other abnormalities of gait and mobility: Secondary | ICD-10-CM

## 2021-08-23 NOTE — Patient Instructions (Signed)

## 2021-08-23 NOTE — Therapy (Signed)
[facility] Outpatient Rehabilitation [facility] [address] [place], [place], [address] Phone: [phone]   Fax:  [phone]  Physical Therapy Treatment  Patient Details  Name: Traci Walker MRN: [id number] Date of Birth: 1971/01/21 Referring Provider (PT): Lisbeth Renshaw, MD   Encounter Date: 08/23/2021   PT End of Session - 08/23/21 1410     Visit Number 2    Number of Visits 16    Date for PT Re-Evaluation 10/08/21    Authorization Type Cigna    PT Start Time 1413    PT Stop Time 1458    PT Time Calculation (min) 45 min    Activity Tolerance Patient tolerated treatment well    Behavior During Therapy [facility] for tasks assessed/performed             Past Medical History:  Diagnosis Date   Infertility, female    clomid use   PCOS (polycystic ovarian syndrome)    Psoriasis    Toxemia in pregnancy     Past Surgical History:  Procedure Laterality Date   CESAREAN SECTION     CHOLECYSTECTOMY     ELBOW SURGERY  2009   right elbow, release of nerve   KNEE SURGERY     Left 4-5 years ago    There were no vitals filed for this visit.   Subjective Assessment - 08/23/21 1415     Subjective I am a 2 /10  today . I have the pain constantly but I have a hard time squatting to get cases of water I am an 8/10.    Currently in Pain? Yes    Pain Score 2     Pain Location Sacrum    Pain Orientation Right;Left    Pain Descriptors / Indicators Sharp    Multiple Pain Sites Yes    Pain Score 2    Pain Location Hip    Pain Orientation Right;Left    Pain Descriptors / Indicators Aching              [facility] Adult PT Treatment/Exercise:   Therapeutic Exercise: Cat Cow to Child pose 2 x 10 with VC and TC PPT 2 x 10 with VC and TC Bridge  3 x 10 Piriformis stretch  R and L supine 3 x each with 20-30 sec hold Lower Trunk rotation 5 x 20 sec hold to R and to L Squat with Chair touch 1 x 15 and 1 x 10 reps   Added to HEP Access Code: [email or web address]:  [email or web address]: 11/29/2022Prepared by: Wayland Denis BeardsleyExercises   Supine Lower Trunk Rotation - 1 x daily - 7 x weekly - 3 sets - 10 reps  Supine Piriformis Stretch with Leg Straight - 1-2 x daily - 7 x weekly - 1 sets - 3 reps - 20-30 sec hold  Cat Cow to Child's Pose - 1 x daily - 7 x weekly - 2 sets - 10 reps  Squat with Chair Touch - 1 x daily - 7 x weekly - 3 sets - 10 reps  Manual Therapy:     Neuromuscular re-ed:     Therapeutic Activity:     Self Care: Educated on proper squat mechanics.  Pt tends to pitch trunk forward Educated on TPDN as below   Modalities: Moist heat on low back concurrent with exercise 20 min   Trigger Point Dry Needling Treatment: Pre-treatment instruction: Patient instructed on dry needling rationale, procedures, and possible side effects including pain during treatment (achy,cramping feeling), bruising, drop of blood,  lightheadedness, nausea, sweating. Patient Consent Given: Yes Education handout provided: Yes Muscles treated: Left glut med, bil piriformis and L5- S1 bil Needle size and number: .30x74mm x 3 Electrical stimulation performed: No Parameters: N/A Treatment response/outcome: Twitch response elicited, Palpable decrease in muscle tension, and increased motion with less pain Post-treatment instructions: Patient instructed to expect possible mild to moderate muscle soreness later today and/or tomorrow. Patient instructed in methods to reduce muscle soreness and to continue prescribed HEP.  Patient was also educated on signs and symptoms of infection and to seek medical attention should they occur. Patient verbalized understanding of these instructions and education.    ITEMS NOT PERFORMED TODAY:                     Trigger Point Dry Needling - 08/23/21 0001     Consent Given? Yes    Education Handout Provided Yes    Muscles Treated Back/Hip Lumbar multifidi;Gluteus medius;Piriformis   L5 to  s-1 bil   Gluteus Medius Response Twitch response elicited;Palpable increased muscle length   left   Piriformis Response Twitch response elicited;Palpable increased muscle length    Lumbar multifidi Response Twitch response elicited;Palpable increased muscle length                     PT Short Term Goals - 08/13/21 1111       PT SHORT TERM GOAL #1   Title Richardine will be >75% HEP compliant to improve carryover between sessions and facilitate independent management of condition    Target Date 09/03/21               PT Long Term Goals - 08/13/21 1112       PT LONG TERM GOAL #1   Title Target date for all long term goals:  10/08/20      PT LONG TERM GOAL #2   Title Tiffanny will improve FOTO score from 54 (on evaluation) to 66 as a proxy for functional improvement      PT LONG TERM GOAL #3   Title Breia will improve 30'' STS (MCID 2) to >/= 8x (w/ UE?: N) to show improved LE strength and improved transfers  EVAL: 4x  w/ UE? Y      PT LONG TERM GOAL #4   Title Patt will report >/= 50% decrease in pain from evaluation  EVAL: 10/10 max pain      PT LONG TERM GOAL #5   Title Pat will be able to lift 15# from the floor 10x, not limited by pain  EVAL: painful and limited                   Plan - 08/23/21 1411     Clinical Impression Statement Pt enters clinic with 2/10 pain in Lumbar sacral area and bil piriformis.  Pt consents to TPDN and was closely monitored. Pt with decrease muscle tension and decrease pain post TPDn and more comfortable ability to perform exercises.  Pt will continue added to HEP to progress to pain free level of function    PT Frequency 2x / week    PT Duration 8 weeks    PT Treatment/Interventions ADLs/Self Care Home Management;Iontophoresis 4mg /ml Dexamethasone;Gait training;Therapeutic activities;Therapeutic exercise;Neuromuscular re-education;Manual techniques;Dry needling;Spinal Manipulations;Joint Manipulations    PT Next  Visit Plan gradual core strengthening    PT Home Exercise Plan [email or web address]    Consulted and Agree with Plan of Care Patient  Patient will benefit from skilled therapeutic intervention in order to improve the following deficits and impairments:  Abnormal gait, Difficulty walking, Pain, Decreased strength, Decreased activity tolerance, Decreased range of motion  Visit Diagnosis: Chronic low back pain, unspecified back pain laterality, unspecified whether sciatica present  Muscle weakness  Other abnormalities of gait and mobility     Problem List There are no problems to display for this patient.  Garen Lah, PT, ATRIC Certified Exercise Expert for the Aging Adult  08/23/21 5:10 PM Phone: [phone] Fax: [phone]   [facility] [address] [place], [place], [address] Phone: [phone]   Fax:  [phone]  Name: Traci Walker MRN: [id number] Date of Birth: 1971/04/17

## 2021-08-30 ENCOUNTER — Other Ambulatory Visit: Payer: Self-pay

## 2021-08-30 ENCOUNTER — Ambulatory Visit: Payer: Managed Care, Other (non HMO) | Attending: Neurosurgery | Admitting: Physical Therapy

## 2021-08-30 ENCOUNTER — Encounter: Payer: Self-pay | Admitting: Physical Therapy

## 2021-08-30 DIAGNOSIS — M6281 Muscle weakness (generalized): Secondary | ICD-10-CM | POA: Diagnosis present

## 2021-08-30 DIAGNOSIS — R2689 Other abnormalities of gait and mobility: Secondary | ICD-10-CM

## 2021-08-30 DIAGNOSIS — G8929 Other chronic pain: Secondary | ICD-10-CM | POA: Insufficient documentation

## 2021-08-30 DIAGNOSIS — M545 Low back pain, unspecified: Secondary | ICD-10-CM

## 2021-08-30 NOTE — Therapy (Signed)
[facility] Outpatient Rehabilitation [facility] [address] [place], [place], [address] Phone: [phone]   Fax:  [phone]  Physical Therapy Treatment  Patient Details  Name: Geovana Gebel MRN: [id number] Date of Birth: [birth year] Referring Provider (PT): Lisbeth Renshaw, MD   Encounter Date: 08/30/2021   PT End of Session - 08/30/21 1201     Visit Number 3    Number of Visits 16    Date for PT Re-Evaluation 10/08/21    Authorization Type Cigna    PT Start Time 1146    PT Stop Time 1230    PT Time Calculation (min) 44 min    Activity Tolerance Patient tolerated treatment well    Behavior During Therapy [facility] for tasks assessed/performed             Past Medical History:  Diagnosis Date   Infertility, female    clomid use   PCOS (polycystic ovarian syndrome)    Psoriasis    Toxemia in pregnancy     Past Surgical History:  Procedure Laterality Date   CESAREAN SECTION     CHOLECYSTECTOMY     ELBOW SURGERY  2009   right elbow, release of nerve   KNEE SURGERY     Left 4-5 years ago    There were no vitals filed for this visit.   Subjective Assessment - 08/30/21 1158     Currently in Pain? Yes    Pain Score 1     Pain Location Sacrum    Pain Orientation Left    Pain Descriptors / Indicators Sore;Tender                  [facility] Adult PT Treatment/Exercise:   Therapeutic Exercise:  PPT 3x 10 with VC and TC to bend knees   pt has been doing without hooklying position at home Bridge  1 x10 then following by 2 x 10 with 15 # KB to increase reserve Lower Trunk rotation 5 x 20 sec hold to R and to L Piriformis stretch  R and L supine 3 x each with 20-30 sec hold Table top 90/90 knee/hip with toe taps 2 x 10 then holding 15 lb overhead for increase abdominal engagement 2 x 10 Sitting figure 4 stretch 2 x 30 sec Left harder than R Cat Cow to Child pose 2 x 10 with VC and TC Squat with Chair touch 15 # KB 2x 10 Reverse lunge with UE  support with chair and mat 2 x 10  Pt tends to lean to R      Manual Therapy:       Neuromuscular re-ed:       Therapeutic Activity:       Self Care: Using handout to discuss posture, standing sitting and sleeping. Household ADL/ back /injury position and strategies to reduce pressure in back.  Lifting guidelines with demo of 15 # KB, demo lifting stool. 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                          PT Education - 08/30/21 1154     Education Details posture and body mechanics and information on lifting    Person(s) Educated Patient    Methods Explanation;Demonstration;Tactile cues;Verbal cues;Handout    Comprehension Verbalized understanding;Returned demonstration              PT Short Term Goals - 08/30/21 1159  PT SHORT TERM GOAL #1   Title Geonna will be >75% HEP compliant to improve carryover between sessions and facilitate independent management of condition    Baseline Pt with 1/10 pain today and only needing exercise    Status Achieved    Target Date 09/03/21               PT Long Term Goals - 08/30/21 1200       PT LONG TERM GOAL #1   Title Target date for all long term goals:  10/08/20    Status On-going      PT LONG TERM GOAL #2   Title Jasmyne will improve FOTO score from 54 (on evaluation) to 66 as a proxy for functional improvement    Status Unable to assess      PT LONG TERM GOAL #3   Title Hanan will improve 30'' STS (MCID 2) to >/= 8x (w/ UE?: N) to show improved LE strength and improved transfers  EVAL: 4x  w/ UE? Y    Status On-going      PT LONG TERM GOAL #4   Title Alessandria will report >/= 50% decrease in pain from evaluation  EVAL: 10/10 max pain    Status On-going      PT LONG TERM GOAL #5   Title Tobin will be able to lift 15# from the floor 10x, not limited by pain  EVAL: painful and limited    Baseline Pt doing 15 lb KB with bridging  today    Status On-going                   Plan - 08/30/21 1202     Clinical Impression Statement Pt enters clinic with 1/10 pain in left SI but declines TPDN and works on HEP/exercises and educated on ADL/posture/lifting self care today.  Pt now less than a 1/10 at end of session.  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 with some decreased AROM in left hip and worked in sitting piriformis stretch today which she says she can use during her work day.  Will continue working toward completion of goals next visit.  Pt now squatting with 15 #    PT Frequency 2x / week    PT Duration 8 weeks    PT Treatment/Interventions ADLs/Self Care Home Management;Iontophoresis 4mg /ml Dexamethasone;Gait training;Therapeutic activities;Therapeutic exercise;Neuromuscular re-education;Manual techniques;Dry needling;Spinal Manipulations;Joint Manipulations    PT Next Visit Plan GOALS  test 30 sec sit to stand and abilty to deadlift 15 # from ground. gradual core strengthening added goblet squat and lunge to HEP, work on body weight exercises for LE strength. side plank    PT Home Exercise Plan [email or web address]    Consulted and Agree with Plan of Care Patient             Patient will benefit from skilled therapeutic intervention in order to improve the following deficits and impairments:  Abnormal gait, Difficulty walking, Pain, Decreased strength, Decreased activity tolerance, Decreased range of motion  Visit Diagnosis: Chronic low back pain, unspecified back pain laterality, unspecified whether sciatica present  Muscle weakness  Other abnormalities of gait and mobility     Problem List There are no problems to display for this patient.   , PT, ATRIC Certified Exercise Expert for the Aging Adult  08/30/21 12:37 PM Phone: [phone] Fax: [phone]  [facility] Outpatient  Rehabilitation [facility] [address] [place], [place], [address] Phone: [phone]   Fax:  [phone]  Name: Chayla Shands MRN: [id number] Date of Birth: Nov 04, 1970

## 2021-08-30 NOTE — Patient Instructions (Signed)
Sleeping on Back  Place pillow under knees. A pillow with cervical support and a roll around waist are also helpful. Copyright  VHI. All rights reserved.  Sleeping on Side Place pillow between knees. Use cervical support under neck and a roll around waist as needed. Copyright  VHI. All rights reserved.   Sleeping on Stomach   If this is the only desirable sleeping position, place pillow under lower legs, and under stomach or chest as needed.  Posture - Sitting   Sit upright, head facing forward. Try using a roll to support lower back. Keep shoulders relaxed, and avoid rounded back. Keep hips level with knees. Avoid crossing legs for long periods. Stand to Sit / Sit to Stand   To sit: Bend knees to lower self onto front edge of chair, then scoot back on seat. To stand: Reverse sequence by placing one foot forward, and scoot to front of seat. Use rocking motion to stand up.   Work Height and Reach  Ideal work height is no more than 2 to 4 inches below elbow level when standing, and at elbow level when sitting. Reaching should be limited to arm's length, with elbows slightly bent.  Bending  Bend at hips and knees, not back. Keep feet shoulder-width apart.    Posture - Standing   Good posture is important. Avoid slouching and forward head thrust. Maintain curve in low back and align ears over shoul- ders, hips over ankles.  Alternating Positions   Alternate tasks and change positions frequently to reduce fatigue and muscle tension. Take rest breaks. Computer Work   Position work to Programmer, multimedia. Use proper work and seat height. Keep shoulders back and down, wrists straight, and elbows at right angles. Use chair that provides full back support. Add footrest and lumbar roll as needed.  Getting Into / Out of Car  Lower self onto seat, scoot back, then bring in one leg at a time. Reverse sequence to get out.  Dressing  Lie on back to pull socks or slacks over feet, or sit  and bend leg while keeping back straight.    Housework - Sink  Place one foot on ledge of cabinet under sink when standing at sink for prolonged periods.   Pushing / Pulling  Pushing is preferable to pulling. Keep back in proper alignment, and use leg muscles to do the work.  Deep Squat   Squat and lift with both arms held against upper trunk. Tighten stomach muscles without holding breath. Use smooth movements to avoid jerking.  Avoid Twisting   Avoid twisting or bending back. Pivot around using foot movements, and bend at knees if needed when reaching for articles.  Carrying Luggage   Distribute weight evenly on both sides. Use a cart whenever possible. Do not twist trunk. Move body as a unit.   Lifting Principles Maintain proper posture and head alignment. Slide object as close as possible before lifting. Move obstacles out of the way. Test before lifting; ask for help if too heavy. Tighten stomach muscles without holding breath. Use smooth movements; do not jerk. Use legs to do the work, and pivot with feet. Distribute the work load symmetrically and close to the center of trunk. Push instead of pull whenever possible.   Ask For Help   Ask for help and delegate to others when possible. Coordinate your movements when lifting together, and maintain the low back curve.  Log Roll   Lying on back, bend left knee and place left  arm across chest. Roll all in one movement to the right. Reverse to roll to the left. Always move as one unit. Housework - Sweeping  Use long-handled equipment to avoid stooping.   Housework - Wiping  Position yourself as close as possible to reach work surface. Avoid straining your back.  Laundry - Unloading Wash   To unload small items at bottom of washer, lift leg opposite to arm being used to reach.  Gardening - Raking  Move close to area to be raked. Use arm movements to do the work. Keep back straight and avoid twisting.      Cart  When reaching into cart with one arm, lift opposite leg to keep back straight.   Getting Into / Out of Bed  Lower self to lie down on one side by raising legs and lowering head at the same time. Use arms to assist moving without twisting. Bend both knees to roll onto back if desired. To sit up, start from lying on side, and use same move-ments in reverse. Housework - Vacuuming  Hold the vacuum with arm held at side. Step back and forth to move it, keeping head up. Avoid twisting.   Laundry - Armed forces training and education officer so that bending and twisting can be avoided.   Laundry - Unloading Dryer  Squat down to reach into clothes dryer or use a reacher.  Gardening - Weeding / Psychiatric nurse or Kneel. Knee pads may be helpful.                    Garen Lah, PT, ATRIC Certified Exercise Expert for the Aging Adult  08/30/21 11:54 AM Phone: [phone] Fax: [phone]

## 2021-09-07 ENCOUNTER — Other Ambulatory Visit: Payer: Self-pay

## 2021-09-07 ENCOUNTER — Ambulatory Visit: Payer: Managed Care, Other (non HMO) | Admitting: Physical Therapy

## 2021-09-07 ENCOUNTER — Encounter: Payer: Self-pay | Admitting: Physical Therapy

## 2021-09-07 DIAGNOSIS — R2689 Other abnormalities of gait and mobility: Secondary | ICD-10-CM

## 2021-09-07 DIAGNOSIS — M6281 Muscle weakness (generalized): Secondary | ICD-10-CM

## 2021-09-07 DIAGNOSIS — G8929 Other chronic pain: Secondary | ICD-10-CM

## 2021-09-07 DIAGNOSIS — M545 Low back pain, unspecified: Secondary | ICD-10-CM | POA: Diagnosis not present

## 2021-09-07 NOTE — Therapy (Signed)
[facility] [address] [place], [place], [address] Phone: [phone]   Fax:  [phone]  Physical Therapy Treatment  Patient Details  Name: Traci Walker MRN: [id number] Date of Birth: [birth year] Referring Provider (PT): Lisbeth Renshaw, MD   Encounter Date: 09/07/2021   PT End of Session - 09/07/21 1130     Visit Number 4    Number of Visits 16    Date for PT Re-Evaluation 10/08/21    Authorization Type [employer]    PT Start Time 1130    PT Stop Time 1212    PT Time Calculation (min) 42 min    Activity Tolerance Patient tolerated treatment well    Behavior During Therapy [facility] for tasks assessed/performed             Past Medical History:  Diagnosis Date   Infertility, female    clomid use   PCOS (polycystic ovarian syndrome)    Psoriasis    Toxemia in pregnancy     Past Surgical History:  Procedure Laterality Date   CESAREAN SECTION     CHOLECYSTECTOMY     ELBOW SURGERY  2009   right elbow, release of nerve   KNEE SURGERY     Left 4-5 years ago    There were no vitals filed for this visit.   Subjective Assessment - 09/07/21 1134     Subjective Pt reports that she is improving.  She reports 1.5/10 back pain today.            [facility] Adult PT Treatment/Exercise:   Therapeutic Exercise:   -PPT 3x 10 with resistance with gait belt and foam roller -Bridge  3x10 - with bil shoulder ext with black band -Lower Trunk rotation 20x - Alternating clamshell Blue TB - 2x10 ea - bird dog - 2x10 5'' hold - child's pose to cobra - 2x6 - side plank from knees with UE support - 5x ea - Squat with Chair touch 15 # KB 3x 10 - Reverse lunge with UE support with chair and mat 2 x 10  Pt tends to lean to R (not today)     PT Short Term Goals - 08/30/21 1159       PT SHORT TERM GOAL #1   Title Ayahna will be >75% HEP compliant to improve carryover between sessions and facilitate independent management of  condition    Baseline Pt with 1/10 pain today and only needing exercise    Status Achieved    Target Date 09/03/21               PT Long Term Goals - 08/30/21 1200       PT LONG TERM GOAL #1   Title Target date for all long term goals:  10/08/20    Status On-going      PT LONG TERM GOAL #2   Title Lyndie will improve FOTO score from 54 (on evaluation) to 66 as a proxy for functional improvement    Status Unable to assess      PT LONG TERM GOAL #3   Title Maykayla will improve 30'' STS (MCID 2) to >/= 8x (w/ UE?: N) to show improved LE strength and improved transfers    EVAL: 4x  w/ UE? Y    Status On-going      PT LONG TERM GOAL #4   Title Kamira will report >/= 50% decrease in pain from evaluation    EVAL: 10/10 max pain    Status  On-going      PT LONG TERM GOAL #5   Title Emika will be able to lift 15# from the floor 10x, not limited by pain    EVAL: painful and limited    Baseline Pt doing 15 lb KB with bridging today    Status On-going                   Plan - 09/07/21 1201     Clinical Impression Statement Overall, Cleotha is progressing well with therapy.  Pt reports no increase in baseline pain following therapy.  Today we concentrated on core strengthening and hip strengthening.  Added in a bit more endurance focused work on posterior chain to good effect.  Pt will continue to benefit from skilled physical therapy to address remaining deficits and achieve listed goals.  Continue per POC.    PT Frequency 2x / week    PT Duration 8 weeks    PT Treatment/Interventions ADLs/Self Care Home Management;Iontophoresis 4mg /ml Dexamethasone;Gait training;Therapeutic activities;Therapeutic exercise;Neuromuscular re-education;Manual techniques;Dry needling;Spinal Manipulations;Joint Manipulations    PT Next Visit Plan GOALS  test 30 sec sit to stand and abilty to deadlift 15 # from ground. gradual core strengthening added goblet squat and lunge to HEP, work on body weight  exercises for LE strength. side plank    PT Home Exercise Plan [email or web address]    Consulted and Agree with Plan of Care Patient             Patient will benefit from skilled therapeutic intervention in order to improve the following deficits and impairments:  Abnormal gait, Difficulty walking, Pain, Decreased strength, Decreased activity tolerance, Decreased range of motion  Visit Diagnosis: Chronic low back pain, unspecified back pain laterality, unspecified whether sciatica present  Muscle weakness  Other abnormalities of gait and mobility     Problem List There are no problems to display for this patient.   , PT 09/07/2021, 12:10 PM  [facility] [address] [place], [place], [place] Phone: [phone]   Fax:  [phone]  Name: Traci Walker MRN: Thomasene Lot Date of Birth: [birth year]

## 2021-09-09 ENCOUNTER — Ambulatory Visit: Payer: Managed Care, Other (non HMO) | Admitting: Physical Therapy

## 2021-09-28 ENCOUNTER — Other Ambulatory Visit: Payer: Self-pay

## 2021-09-28 ENCOUNTER — Ambulatory Visit: Payer: Managed Care, Other (non HMO) | Attending: Neurosurgery | Admitting: Physical Therapy

## 2021-09-28 ENCOUNTER — Encounter: Payer: Self-pay | Admitting: Physical Therapy

## 2021-09-28 DIAGNOSIS — G8929 Other chronic pain: Secondary | ICD-10-CM | POA: Diagnosis present

## 2021-09-28 DIAGNOSIS — M545 Low back pain, unspecified: Secondary | ICD-10-CM | POA: Insufficient documentation

## 2021-09-28 DIAGNOSIS — R2689 Other abnormalities of gait and mobility: Secondary | ICD-10-CM | POA: Insufficient documentation

## 2021-09-28 DIAGNOSIS — M6281 Muscle weakness (generalized): Secondary | ICD-10-CM | POA: Insufficient documentation

## 2021-09-28 NOTE — Therapy (Signed)
[facility] [address] [place], [place], [address] Phone: [phone]   Fax:  [phone]  Physical Therapy Treatment  Patient Details  Name: Traci Walker MRN: [id number] Date of Birth: Feb 08, 1971 Referring Provider (PT): Lisbeth Renshaw, MD   Encounter Date: 09/28/2021   PT End of Session - 09/28/21 1530     Visit Number 5    Number of Visits 16    Date for PT Re-Evaluation 10/08/21    Authorization Type [employer]    PT Start Time 1531    PT Stop Time 1612    PT Time Calculation (min) 41 min    Activity Tolerance Patient tolerated treatment well    Behavior During Therapy [facility] for tasks assessed/performed             Past Medical History:  Diagnosis Date   Infertility, female    clomid use   PCOS (polycystic ovarian syndrome)    Psoriasis    Toxemia in pregnancy     Past Surgical History:  Procedure Laterality Date   CESAREAN SECTION     CHOLECYSTECTOMY     ELBOW SURGERY  2009   right elbow, release of nerve   KNEE SURGERY     Left 4-5 years ago    There were no vitals filed for this visit.   Subjective Assessment - 09/28/21 1534     Subjective Pt reports that her back has improved significantly.  She has worked 14/15 days and feels she is doing ok.  She reports 2/10 back pain today.              [facility] Adult PT Treatment/Exercise:   Therapeutic Exercise: - nu-step L5 26m while taking subjective and planning session with patient - PPT 2x15 with resistance with gait belt and foam roller - Bridge  3x10 - with bil shoulder ext with black band - staggered bridge 10x ea - Lower Trunk rotation 20x - S/L clamshell Blue TB - 2x10 ea - pilates ring squeeze - 2x10 - bird dog - 2x10 5'' hold - child's pose to cobra - 2x6 - side plank from knees with UE support - 10x ea - Squat with Chair touch 15 # KB 3x 10 - Hip hinge to 4'' step - 25# - 3x10    PT Short Term Goals - 08/30/21 1159       PT SHORT  TERM GOAL #1   Title Jaydon will be >75% HEP compliant to improve carryover between sessions and facilitate independent management of condition    Baseline Pt with 1/10 pain today and only needing exercise    Status Achieved    Target Date 09/03/21               PT Long Term Goals - 08/30/21 1200       PT LONG TERM GOAL #1   Title Target date for all long term goals:  10/08/20    Status On-going      PT LONG TERM GOAL #2   Title Adlai will improve FOTO score from 54 (on evaluation) to 66 as a proxy for functional improvement    Status Unable to assess      PT LONG TERM GOAL #3   Title Jess will improve 30'' STS (MCID 2) to >/= 8x (w/ UE?: N) to show improved LE strength and improved transfers    EVAL: 4x  w/ UE? Y    Status On-going      PT LONG TERM  GOAL #4   Title Maalle will report >/= 50% decrease in pain from evaluation    EVAL: 10/10 max pain    Status On-going      PT LONG TERM GOAL #5   Title Tyshawn will be able to lift 15# from the floor 10x, not limited by pain    EVAL: painful and limited    Baseline Pt doing 15 lb KB with bridging today    Status On-going                   Plan - 09/28/21 1548     Clinical Impression Statement Adah is progressing well with therapy.  Pt reports no increase in baseline pain following therapy.  Today we concentrated on core strengthening and hip strengthening.  Pt continues to improve core and hip strength.  We were able to add in a hip hinge today to good effect.  Pt will continue to benefit from skilled physical therapy to address remaining deficits and achieve listed goals.  Continue per POC.    PT Frequency 2x / week    PT Duration 8 weeks    PT Treatment/Interventions ADLs/Self Care Home Management;Iontophoresis 4mg /ml Dexamethasone;Gait training;Therapeutic activities;Therapeutic exercise;Neuromuscular re-education;Manual techniques;Dry needling;Spinal Manipulations;Joint Manipulations    PT Next Visit Plan  GOALS  test 30 sec sit to stand and abilty to deadlift 15 # from ground. gradual core strengthening added goblet squat and lunge to HEP, work on body weight exercises for LE strength. side plank    PT Home Exercise Plan [email or web address]    Consulted and Agree with Plan of Care Patient             Patient will benefit from skilled therapeutic intervention in order to improve the following deficits and impairments:  Abnormal gait, Difficulty walking, Pain, Decreased strength, Decreased activity tolerance, Decreased range of motion  Visit Diagnosis: Chronic low back pain, unspecified back pain laterality, unspecified whether sciatica present  Other abnormalities of gait and mobility  Muscle weakness     Problem List There are no problems to display for this patient.   , PT 09/28/2021, 4:12 PM  [facility] [address] [place], [place], [place] Phone: [phone]   Fax:  [phone]  Name: Traci Walker MRN: Thomasene Lot Date of Birth: 12/23/1970

## 2021-10-05 ENCOUNTER — Encounter: Payer: Managed Care, Other (non HMO) | Admitting: Physical Therapy

## 2021-10-07 ENCOUNTER — Ambulatory Visit: Payer: Managed Care, Other (non HMO) | Admitting: Physical Therapy

## 2021-10-07 ENCOUNTER — Other Ambulatory Visit: Payer: Self-pay

## 2021-10-07 ENCOUNTER — Encounter: Payer: Self-pay | Admitting: Physical Therapy

## 2021-10-07 DIAGNOSIS — M6281 Muscle weakness (generalized): Secondary | ICD-10-CM

## 2021-10-07 DIAGNOSIS — R2689 Other abnormalities of gait and mobility: Secondary | ICD-10-CM

## 2021-10-07 DIAGNOSIS — G8929 Other chronic pain: Secondary | ICD-10-CM

## 2021-10-07 DIAGNOSIS — M545 Low back pain, unspecified: Secondary | ICD-10-CM

## 2021-10-07 NOTE — Therapy (Signed)
[place], [place], [address] Phone: [phone]   Fax:  [phone]  PHYSICAL THERAPY DISCHARGE SUMMARY  Visits from Start of Care: 6  Current functional level related to goals / functional outcomes: See assessment/goals   Remaining deficits: See assessment/goals   Education / Equipment: HEP and D/C plans  Patient agrees to discharge. Patient goals were met. Patient is being discharged due to meeting the stated rehab goals.  Patient Details  Name: Traci Walker MRN: [id number] Date of Birth: October 17, 1970 Referring Provider (PT): Consuella Lose, MD   Encounter Date: 10/07/2021   PT End of Session - 10/07/21 1343     Visit Number 6    Number of Visits 16    Date for PT Re-Evaluation 10/08/21    Authorization Type [employer]a    PT Start Time 1343    PT Stop Time 1428    PT Time Calculation (min) 45 min    Activity Tolerance Patient tolerated treatment well    Behavior During Therapy WFL for tasks assessed/performed             Past Medical History:  Diagnosis Date   Infertility, female    clomid use   PCOS (polycystic ovarian syndrome)    Psoriasis    Toxemia in pregnancy     Past Surgical History:  Procedure Laterality Date   CESAREAN SECTION     CHOLECYSTECTOMY     ELBOW SURGERY  2009   right elbow, release of nerve   KNEE SURGERY     Left 4-5 years ago    There were no vitals filed for this visit.   Subjective Assessment - 10/07/21 1348     Subjective Pt reports that she has seen significant improvement in her ability to lift and bend.  She reports 1/10 back pain today.              Objective:  55'' sit to stand: 14x   FOTO: 75     [facility] Adult PT Treatment/Exercise:   Therapeutic Exercise: - nu-step L5 65mwhile taking subjective and planning session with patient - staggered bridge 10x ea - Lower Trunk rotation 20x - S/L clamshell Blue TB - 2x10 ea - pilates  ring squeeze - 2x10 - bird dog - x10 5'' hold - child's pose to cobra - 2x6 - side plank from knees with UE support - 2x10 ea - Hip hinge to 15# - 3x10  Therapeutic Activity - collecting information for goals, checking progress, and reviewing with patient    PT Short Term Goals - 08/30/21 1159       PT SHORT TERM GOAL #1   Title Analia will be >75% HEP compliant to improve carryover between sessions and facilitate independent management of condition    Baseline Pt with 1/10 pain today and only needing exercise    Status Achieved    Target Date 09/03/21               PT Long Term Goals - 10/07/21 1357       PT LONG TERM GOAL #1   Title Target date for all long term goals:  10/08/20    Status On-going      PT LONG TERM GOAL #2   Title [name] will improve FOTO score from 54 (on evaluation) to 66 as a proxy for functional improvement    Baseline 1/13: 75    Status Achieved      PT LONG TERM GOAL #  3   Title [name] will improve 30'' STS (MCID 2) to >/= 8x (w/ UE?: N) to show improved LE strength and improved transfers    EVAL: 4x  w/ UE? Y    Baseline 1/13: 14x    Status Achieved      PT LONG TERM GOAL #4   Title [name] will report >/= 50% decrease in pain from evaluation    EVAL: 10/10 max pain    Status Achieved      PT LONG TERM GOAL #5   Title [name] will be able to lift 15# from the floor 10x, not limited by pain    EVAL: painful and limited    Baseline Pt doing 15 lb KB with bridging today    Status Achieved                   Plan - 10/07/21 1356     Clinical Impression Statement [name] has progressed well with therapy.  Improved impairments include: core and hip strength, improved lumbar ROM, reduced pain.  Functional improvements include: improved ability to squat, lift, and complete work with reduced pain.  Progressions needed include: continued work at home with HEP.  Barriers to progress include: none.  Please see baseline and/or status  section in "Goals" for specific progress on short term and long term goals established at evaluation.  I recommend D/C home with HEP; pt agrees with plan.    PT Frequency 2x / week    PT Duration 8 weeks    PT Treatment/Interventions ADLs/Self Care Home Management;Iontophoresis 35m/ml Dexamethasone;Gait training;Therapeutic activities;Therapeutic exercise;Neuromuscular re-education;Manual techniques;Dry needling;Spinal Manipulations;Joint Manipulations    PT Next Visit Plan GOALS  test 30 sec sit to stand and abilty to deadlift 15 # from ground. gradual core strengthening added goblet squat and lunge to HEP, work on body weight exercises for LE strength. side plank    PT Home Exercise Plan [email or web address]    Consulted and Agree with Plan of Care Patient             Patient will benefit from skilled therapeutic intervention in order to improve the following deficits and impairments:  Abnormal gait, Difficulty walking, Pain, Decreased strength, Decreased activity tolerance, Decreased range of motion  Visit Diagnosis: Chronic low back pain, unspecified back pain laterality, unspecified whether sciatica present  Other abnormalities of gait and mobility  Muscle weakness     Problem List There are no problems to display for this patient.   KMathis Dad PT 10/07/2021, 2:00 PM  [facility] [address]Phone: [phone]  Fax:  [phone] Name: Traci DanleyMRN: [id number] of Birth: [birth year]
